# Patient Record
Sex: Female | Born: 1990 | Race: Black or African American | Hispanic: No | Marital: Married | Smoking: Former smoker
Health system: Southern US, Community
[De-identification: ages and names within clinical notes are randomized; demographics above are authoritative.]

## PROBLEM LIST (undated history)

## (undated) DIAGNOSIS — Z973 Presence of spectacles and contact lenses: Secondary | ICD-10-CM

## (undated) DIAGNOSIS — Z8742 Personal history of other diseases of the female genital tract: Secondary | ICD-10-CM

## (undated) DIAGNOSIS — D649 Anemia, unspecified: Secondary | ICD-10-CM

## (undated) DIAGNOSIS — Z8759 Personal history of other complications of pregnancy, childbirth and the puerperium: Secondary | ICD-10-CM

## (undated) DIAGNOSIS — E559 Vitamin D deficiency, unspecified: Secondary | ICD-10-CM

## (undated) DIAGNOSIS — D573 Sickle-cell trait: Secondary | ICD-10-CM

## (undated) HISTORY — PX: NO PAST SURGERIES: SHX2092

---
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
## 2010-02-02 ENCOUNTER — Inpatient Hospital Stay (HOSPITAL_COMMUNITY): Admission: AD | Admit: 2010-02-02 | Discharge: 2010-02-03 | Payer: Self-pay | Admitting: Obstetrics & Gynecology

## 2010-06-09 LAB — WET PREP, GENITAL: Trich, Wet Prep: NONE SEEN

## 2010-06-09 LAB — POCT PREGNANCY, URINE: Preg Test, Ur: NEGATIVE

## 2010-06-09 LAB — CBC
HCT: 39.1 % (ref 36.0–46.0)
Hemoglobin: 13 g/dL (ref 12.0–15.0)
MCHC: 33.4 g/dL (ref 30.0–36.0)
MCV: 88.1 fL (ref 78.0–100.0)
RDW: 13.4 % (ref 11.5–15.5)
WBC: 9.8 10*3/uL (ref 4.0–10.5)

## 2010-06-09 LAB — URINE MICROSCOPIC-ADD ON

## 2010-06-09 LAB — URINALYSIS, ROUTINE W REFLEX MICROSCOPIC
Bilirubin Urine: NEGATIVE
Protein, ur: NEGATIVE mg/dL
Specific Gravity, Urine: 1.025 (ref 1.005–1.030)
pH: 6 (ref 5.0–8.0)

## 2010-06-09 LAB — HCG, QUANTITATIVE, PREGNANCY: hCG, Beta Chain, Quant, S: <2 m[IU]/mL (ref <5)

## 2010-06-09 LAB — ABO/RH: ABO/RH(D): B POS

## 2010-07-02 LAB — URINE MICROSCOPIC-ADD ON

## 2010-07-02 LAB — CBC
HCT: 38.2 % (ref 36.0–46.0)
MCHC: 33.6 g/dL (ref 30.0–36.0)
Platelets: 375 10*3/uL (ref 150–400)
RBC: 4.24 MIL/uL (ref 3.87–5.11)
WBC: 7.9 10*3/uL (ref 4.0–10.5)

## 2010-07-02 LAB — URINALYSIS, ROUTINE W REFLEX MICROSCOPIC
Ketones, ur: 15 mg/dL — AB
Ketones, ur: 15 mg/dL — AB
Protein, ur: 100 mg/dL — AB
Protein, ur: 100 mg/dL — AB
Specific Gravity, Urine: 1.013 (ref 1.005–1.030)

## 2010-07-02 LAB — POCT I-STAT, CHEM 8
Calcium, Ion: 1.17 mmol/L (ref 1.12–1.32)
Chloride: 104 mEq/L (ref 96–112)
Creatinine, Ser: 0.8 mg/dL (ref 0.4–1.2)
Glucose, Bld: 85 mg/dL (ref 70–99)
Hemoglobin: 13.3 g/dL (ref 12.0–15.0)
Potassium: 4.1 mEq/L (ref 3.5–5.1)

## 2010-07-02 LAB — DIFFERENTIAL
Eosinophils Absolute: 0.2 10*3/uL (ref 0.0–0.7)
Lymphs Abs: 2.1 10*3/uL (ref 0.7–4.0)
Neutro Abs: 5.1 10*3/uL (ref 1.7–7.7)
Neutrophils Relative %: 65 % (ref 43–77)

## 2010-07-02 LAB — POCT PREGNANCY, URINE: Preg Test, Ur: NEGATIVE

## 2010-09-28 ENCOUNTER — Inpatient Hospital Stay (HOSPITAL_COMMUNITY)
Admission: AD | Admit: 2010-09-28 | Discharge: 2010-09-28 | Disposition: A | Discharge status: Home / Self Care | Payer: PRIVATE HEALTH INSURANCE | Source: Ambulatory Visit | Attending: Obstetrics & Gynecology | Admitting: Obstetrics & Gynecology

## 2010-09-28 DIAGNOSIS — R319 Hematuria, unspecified: Secondary | ICD-10-CM

## 2010-09-28 DIAGNOSIS — N390 Urinary tract infection, site not specified: Secondary | ICD-10-CM

## 2010-09-28 LAB — POCT PREGNANCY, URINE: Preg Test, Ur: NEGATIVE

## 2010-09-28 LAB — URINE MICROSCOPIC-ADD ON

## 2010-09-28 LAB — URINALYSIS, ROUTINE W REFLEX MICROSCOPIC
Ketones, ur: 15 mg/dL — AB
Specific Gravity, Urine: 1.02 (ref 1.005–1.030)

## 2010-09-30 LAB — URINE CULTURE: Culture  Setup Time: 201207021708

## 2012-09-06 LAB — OB RESULTS CONSOLE HEPATITIS B SURFACE ANTIGEN: Hepatitis B Surface Ag: NEGATIVE

## 2012-09-06 LAB — OB RESULTS CONSOLE RPR: RPR: NONREACTIVE

## 2012-09-06 LAB — OB RESULTS CONSOLE HIV ANTIBODY (ROUTINE TESTING): HIV: NONREACTIVE

## 2012-09-06 LAB — OB RESULTS CONSOLE ANTIBODY SCREEN: ANTIBODY SCREEN: NEGATIVE

## 2012-09-06 LAB — OB RESULTS CONSOLE ABO/RH: RH TYPE: POSITIVE

## 2012-09-06 LAB — OB RESULTS CONSOLE RUBELLA ANTIBODY, IGM: Rubella: IMMUNE

## 2013-03-21 LAB — OB RESULTS CONSOLE GC/CHLAMYDIA
Chlamydia: NEGATIVE
Gonorrhea: NEGATIVE

## 2013-03-23 LAB — OB RESULTS CONSOLE GBS: GBS: NEGATIVE

## 2013-03-29 NOTE — L&D Delivery Note (Signed)
Operative Delivery Note At 12:47 AM a viable female was delivered via Vaginal, Spontaneous Delivery.  Presentation: vertex; Position: Left,, Occiput,, Anterior.  Nuchal cord x1, delivered through cord.  Delivery of the head: 04/14/2013 12:46 AM First maneuver: 04/14/2013 12:46 AM, McRoberts Second maneuver: 04/14/2013 12:47 AM, Suprapubic Pressure   Verbal consent: obtained from patient.  APGAR: 7, 9; weight 8 lb 9 oz (3884 g).   Placenta status: Intact, Spontaneous.   Cord: 3 vessels with the following complications: None.  Cord pH: 7.384 and 7.294.  Baby taken to Nursery to transition, needed O2.  Anesthesia: Epidural  Episiotomy: None Lacerations: 1st degree vaginal extending up left labia Suture Repair: 3.0 vicryl Est. Blood Loss (mL): 300  Mom to postpartum.  Baby to Nursery.  Routine PP orders Breastfeeding Outpatient circumcision    [name], Yesenia Velez 04/14/2013, 1:28 AM

## 2013-04-13 ENCOUNTER — Inpatient Hospital Stay (HOSPITAL_COMMUNITY): Payer: Medicaid Other | Admitting: Anesthesiology

## 2013-04-13 ENCOUNTER — Inpatient Hospital Stay (HOSPITAL_COMMUNITY)
Admission: AD | Admit: 2013-04-13 | Discharge: 2013-04-16 | DRG: 775 | Disposition: A | Discharge status: Home / Self Care | Payer: Medicaid Other | Source: Ambulatory Visit | Attending: Obstetrics and Gynecology | Admitting: Obstetrics and Gynecology

## 2013-04-13 ENCOUNTER — Encounter (HOSPITAL_COMMUNITY): Payer: Self-pay | Admitting: *Deleted

## 2013-04-13 ENCOUNTER — Encounter (HOSPITAL_COMMUNITY): Payer: Medicaid Other | Admitting: Anesthesiology

## 2013-04-13 DIAGNOSIS — Z87891 Personal history of nicotine dependence: Secondary | ICD-10-CM

## 2013-04-13 DIAGNOSIS — O1490 Unspecified pre-eclampsia, unspecified trimester: Secondary | ICD-10-CM

## 2013-04-13 DIAGNOSIS — D573 Sickle-cell trait: Secondary | ICD-10-CM | POA: Diagnosis present

## 2013-04-13 DIAGNOSIS — O9902 Anemia complicating childbirth: Secondary | ICD-10-CM | POA: Diagnosis present

## 2013-04-13 HISTORY — DX: Sickle-cell trait: D57.3

## 2013-04-13 LAB — CBC
HEMATOCRIT: 32.1 % — AB (ref 36.0–46.0)
HEMATOCRIT: 31.7 % — AB (ref 36.0–46.0)
Hemoglobin: 11.1 g/dL — ABNORMAL LOW (ref 12.0–15.0)
Hemoglobin: 11 g/dL — ABNORMAL LOW (ref 12.0–15.0)
MCH: 27.5 pg (ref 26.0–34.0)
MCH: 27.4 pg (ref 26.0–34.0)
MCHC: 34.6 g/dL (ref 30.0–36.0)
MCHC: 34.7 g/dL (ref 30.0–36.0)
MCV: 79.5 fL (ref 78.0–100.0)
MCV: 78.9 fL (ref 78.0–100.0)
Platelets: 342 10*3/uL (ref 150–400)
Platelets: 324 10*3/uL (ref 150–400)
RBC: 4.04 MIL/uL (ref 3.87–5.11)
RBC: 4.02 MIL/uL (ref 3.87–5.11)
RDW: 13.6 % (ref 11.5–15.5)
RDW: 13.5 % (ref 11.5–15.5)
WBC: 13 10*3/uL — ABNORMAL HIGH (ref 4.0–10.5)
WBC: 20.8 10*3/uL — AB (ref 4.0–10.5)

## 2013-04-13 LAB — COMPREHENSIVE METABOLIC PANEL
ALT: 15 U/L (ref 0–35)
AST: 24 U/L (ref 0–37)
Albumin: 2.9 g/dL — ABNORMAL LOW (ref 3.5–5.2)
Alkaline Phosphatase: 138 U/L — ABNORMAL HIGH (ref 39–117)
BILIRUBIN TOTAL: 0.2 mg/dL — AB (ref 0.3–1.2)
BUN: 10 mg/dL (ref 6–23)
CALCIUM: 10.1 mg/dL (ref 8.4–10.5)
CO2: 21 mEq/L (ref 19–32)
Chloride: 102 mEq/L (ref 96–112)
Creatinine, Ser: 0.62 mg/dL (ref 0.50–1.10)
GFR calc Af Amer: >90 mL/min (ref >90)
GFR calc non Af Amer: >90 mL/min (ref >90)
Glucose, Bld: 89 mg/dL (ref 70–99)
Potassium: 4 mEq/L (ref 3.7–5.3)
Sodium: 138 mEq/L (ref 137–147)
TOTAL PROTEIN: 6.9 g/dL (ref 6.0–8.3)

## 2013-04-13 LAB — TYPE AND SCREEN
ABO/RH(D): B POS
Antibody Screen: NEGATIVE

## 2013-04-13 LAB — RPR: RPR Ser Ql: NONREACTIVE

## 2013-04-13 LAB — LACTATE DEHYDROGENASE: LDH: 175 U/L (ref 94–250)

## 2013-04-13 LAB — URIC ACID: URIC ACID, SERUM: 6.8 mg/dL (ref 2.4–7.0)

## 2013-04-13 MED ORDER — OXYTOCIN 10 UNIT/ML IJ SOLN
INTRAMUSCULAR | Status: AC
  Filled 2013-04-13: qty 2

## 2013-04-13 MED ORDER — OXYTOCIN BOLUS FROM INFUSION
500.0000 mL | INTRAVENOUS | Status: DC
  Administered 2013-04-14: 500 mL via INTRAVENOUS

## 2013-04-13 MED ORDER — CITRIC ACID-SODIUM CITRATE 334-500 MG/5ML PO SOLN: 30.0000 mL | ORAL | Status: DC | PRN

## 2013-04-13 MED ORDER — ACETAMINOPHEN 325 MG PO TABS: 650.0000 mg | ORAL_TABLET | ORAL | Status: DC | PRN

## 2013-04-13 MED ORDER — DIPHENHYDRAMINE HCL 50 MG/ML IJ SOLN: 12.5000 mg | INTRAMUSCULAR | Status: DC | PRN

## 2013-04-13 MED ORDER — EPHEDRINE 5 MG/ML INJ
10.0000 mg | INTRAVENOUS | Status: DC | PRN
  Filled 2013-04-13: qty 2

## 2013-04-13 MED ORDER — FENTANYL 2.5 MCG/ML BUPIVACAINE 1/10 % EPIDURAL INFUSION (WH - ANES)
INTRAMUSCULAR | Status: DC | PRN
  Administered 2013-04-13: 14 mL/h via EPIDURAL

## 2013-04-13 MED ORDER — PHENYLEPHRINE 40 MCG/ML (10ML) SYRINGE FOR IV PUSH (FOR BLOOD PRESSURE SUPPORT)
80.0000 ug | PREFILLED_SYRINGE | INTRAVENOUS | Status: DC | PRN
  Filled 2013-04-13: qty 2

## 2013-04-13 MED ORDER — OXYTOCIN 40 UNITS IN LACTATED RINGERS INFUSION - SIMPLE MED
1.0000 m[IU]/min | INTRAVENOUS | Status: DC
  Administered 2013-04-13: 2 m[IU]/min via INTRAVENOUS

## 2013-04-13 MED ORDER — IBUPROFEN 600 MG PO TABS: 600.0000 mg | ORAL_TABLET | Freq: Four times a day (QID) | ORAL | Status: DC | PRN

## 2013-04-13 MED ORDER — LIDOCAINE HCL (PF) 1 % IJ SOLN
30.0000 mL | INTRAMUSCULAR | Status: DC | PRN
Start: 2013-04-13 — End: 2013-04-14
  Filled 2013-04-13: qty 30

## 2013-04-13 MED ORDER — EPHEDRINE 5 MG/ML INJ
INTRAVENOUS | Status: AC
  Filled 2013-04-13: qty 4

## 2013-04-13 MED ORDER — LIDOCAINE HCL (PF) 1 % IJ SOLN
INTRAMUSCULAR | Status: DC | PRN
  Administered 2013-04-13 (×2): 9 mL

## 2013-04-13 MED ORDER — OXYTOCIN 40 UNITS IN LACTATED RINGERS INFUSION - SIMPLE MED
62.5000 mL/h | INTRAVENOUS | Status: DC
  Filled 2013-04-13: qty 1000

## 2013-04-13 MED ORDER — LACTATED RINGERS IV SOLN: 500.0000 mL | Freq: Once | INTRAVENOUS | Status: DC

## 2013-04-13 MED ORDER — PHENYLEPHRINE 40 MCG/ML (10ML) SYRINGE FOR IV PUSH (FOR BLOOD PRESSURE SUPPORT)
PREFILLED_SYRINGE | INTRAVENOUS | Status: AC
  Filled 2013-04-13: qty 10

## 2013-04-13 MED ORDER — LACTATED RINGERS IV SOLN
500.0000 mL | INTRAVENOUS | Status: DC | PRN
  Administered 2013-04-13: 1000 mL via INTRAVENOUS

## 2013-04-13 MED ORDER — OXYCODONE-ACETAMINOPHEN 5-325 MG PO TABS: 1.0000 | ORAL_TABLET | ORAL | Status: DC | PRN

## 2013-04-13 MED ORDER — FENTANYL 2.5 MCG/ML BUPIVACAINE 1/10 % EPIDURAL INFUSION (WH - ANES)
INTRAMUSCULAR | Status: AC
  Administered 2013-04-13: 14 mL/h via EPIDURAL
  Filled 2013-04-13: qty 125

## 2013-04-13 MED ORDER — FENTANYL CITRATE 0.05 MG/ML IJ SOLN: 100.0000 ug | INTRAMUSCULAR | Status: DC | PRN

## 2013-04-13 MED ORDER — FENTANYL 2.5 MCG/ML BUPIVACAINE 1/10 % EPIDURAL INFUSION (WH - ANES)
14.0000 mL/h | INTRAMUSCULAR | Status: DC | PRN
  Administered 2013-04-13: 14 mL/h via EPIDURAL
  Filled 2013-04-13: qty 125

## 2013-04-13 MED ORDER — TERBUTALINE SULFATE 1 MG/ML IJ SOLN: 0.2500 mg | Freq: Once | INTRAMUSCULAR | Status: AC | PRN

## 2013-04-13 MED ORDER — ONDANSETRON HCL 4 MG/2ML IJ SOLN: 4.0000 mg | Freq: Four times a day (QID) | INTRAMUSCULAR | Status: DC | PRN

## 2013-04-13 MED ORDER — LACTATED RINGERS IV SOLN
INTRAVENOUS | Status: DC
  Administered 2013-04-13 (×4): via INTRAVENOUS

## 2013-04-13 NOTE — Progress Notes (Signed)
  Subjective: Patient requesting SVE.  In shower after utilizing birth tub. Desires better pain mgmt. Questions and concerns regarding pain medication and effects on fetus.  Doula and family remain at bedside, supportive.   Objective: BP 130/80  Pulse 102  Temp(Src) 99.1 F (37.3 C) (Oral)  Resp 18  Ht 5\' 4"  (1.626 m)  Wt 226 lb (102.513 kg)  BMI 38.77 kg/m2      FHT: Reassuring per Intermittent Ausc.  UC:   irregular, every 2-5 minutes SVE:   Dilation: 5 Effacement (%): 100 Station: -1 Exam by:: Nigel BridgemanVicki Miking Usrey CNM  Assessment / Plan: Progressive labor GBS (-)  Will proceed with epidural per patient request Reviewed patient and family questions re: epidural  Burnham Trost 04/13/2013, 12:52 PM

## 2013-04-13 NOTE — H&P (Signed)
Yesenia Velez is a 23 y.o. female presenting for SROM, clear fluid, at 0330, appears grossly ruptured w pos fern, denies VB, has GFM.   Pregnancy course: Pt began PNC at [facility] at 9wks, confirmed by [place],  1st trim screen and AFP normal Anatomy US at 19wks, limited cardiac views and L CP cyst noted F/u US at 22wks, anatomy completed and normal, and CP cyst reduced in size Normal 1hr glucola GBS neg   Maternal Medical History:  Reason for admission: Rupture of membranes.  Nausea.  Contractions: Onset was 3-5 hours ago.   Frequency: irregular.   Perceived severity is mild.    Fetal activity: Perceived fetal activity is normal.   Last perceived fetal movement was within the past hour.    Prenatal complications: no prenatal complications Prenatal Complications - Diabetes: none.    OB History   Grav Para Term Preterm Abortions TAB SAB Ect Mult Living   1              Past Medical History  Diagnosis Date  . Sickle cell trait    Past Surgical History  Procedure Laterality Date  . No past surgeries     Family History: family history is not on file. Social History:  reports that she has quit smoking. She does not have any smokeless tobacco history on file. She reports that she does not drink alcohol or use illicit drugs.   Prenatal Transfer Tool  Maternal Diabetes: No Genetic Screening: Normal Maternal Ultrasounds/Referrals: Normal L CP cyst noted at 19wks, reduced in size at f/u at 22wks Fetal Ultrasounds or other Referrals:  None Maternal Substance Abuse:  No Significant Maternal Medications:  None Significant Maternal Lab Results:  Lab values include: Group B Strep negative Other Comments:  None  Review of Systems  Eyes: Negative for blurred vision.  Cardiovascular: Negative for chest pain.  Gastrointestinal: Negative for nausea, vomiting and abdominal pain.  Neurological: Negative for headaches.  All other systems reviewed and are negative.    Dilation:  3.5 Effacement (%): 90 Station: -2 Exam by:: Tonita Cong, CNM Blood pressure 138/92, pulse 101, temperature 98.3 F (36.8 C), temperature source Oral, resp. rate 18, height 5\' 4"  (1.626 m), weight 226 lb (102.513 kg). Maternal Exam:  Uterine Assessment: Contraction strength is mild.  Contraction frequency is irregular.   Abdomen: Patient reports no abdominal tenderness. Fundal height is aga.   Estimated fetal weight is 7-8#.   Fetal presentation: vertex  Introitus: Normal vulva. Normal vagina.  Ferning test: positive.  Amniotic fluid character: clear.  Pelvis: adequate for delivery.   Cervix: Cervix evaluated by digital exam.     Fetal Exam Fetal Monitor Review: Mode: ultrasound.    Fetal State Assessment: Category I - tracings are normal.     Physical Exam  Nursing note and vitals reviewed. Constitutional: She is oriented to person, place, and time. She appears well-developed and well-nourished.  HENT:  Head: Normocephalic.  Eyes: Pupils are equal, round, and reactive to light.  Neck: Normal range of motion.  Cardiovascular: Normal rate, regular rhythm and normal heart sounds.   Respiratory: Effort normal and breath sounds normal.  GI: Soft. Bowel sounds are normal.  Genitourinary: Vagina normal.  Musculoskeletal: Normal range of motion.  Neurological: She is alert and oriented to person, place, and time. She has normal reflexes.  Skin: Skin is warm and dry.  Psychiatric: She has a normal mood and affect. Her behavior is normal.    Prenatal labs: ABO, Rh: --/--/  B POS (01/16 0450) Antibody: NEG (01/16 0450) Rubella: Immune (06/11 0000) RPR: Nonreactive (06/11 0000)  HBsAg: Negative (06/11 0000)  HIV: Non-reactive (06/11 0000)  GBS: Negative (12/26 0000)  Sickle cell trait pos Genetic screens normal   Assessment/Plan: IUP at 2022w3d SROM, without active labor, cervix unchanged from office  GBS neg FHR cat 1 Desires WB BP mildly elevated, CTO at present    Admit to b.s. Per c/w Dr Normand Sloopillard Routine L&D orders  Saline lock ok Will reevaluate later today for need for augmentation if not in active labor     Jayli Fogleman M 04/13/2013, 6:33 AM

## 2013-04-13 NOTE — Progress Notes (Signed)
  Subjective: Patient comfortable in bed.  Family and doula remains supportive at bedside.  Objective: BP 132/76  Pulse 109  Temp(Src) 98.6 F (37 C) (Axillary)  Resp 18  Ht 5\' 4"  (1.626 m)  Wt 226 lb (102.513 kg)  BMI 38.77 kg/m2  SpO2 100%      FHT:  Cat I tracing  UC:   regular, every 1-3 minutes, MVU: 200 SVE:   Dilation: 8 Effacement (%): 100 Station: -1 Exam by:: Nigel BridgemanVicki Isa Hitz CNM Fetus asynclitic with more cervix on right  Assessment / Plan: Progressive Labor Adequate Contractions  Continue present mgmt  Patient encouraged to change positions regularly Will work with position to facilitate rotation and descent   Nigel BridgemanLATHAM, Giuliana Handyside 04/13/2013, 6:33 PM

## 2013-04-13 NOTE — Progress Notes (Signed)
  Subjective:  Sitting in rocking chair. Breathing well with contractions.  SO and doula at bedside.  Tub inflated, but not filled yet.  Objective: BP 135/93  Pulse 101  Temp(Src) 99.7 F (37.6 C) (Axillary)  Resp 16  Ht 5\' 4"  (1.626 m)  Wt 226 lb (102.513 kg)  BMI 38.77 kg/m2      FHT: Cat I Tracing UC:   irregular, every 2-5 minutes SVE:   Deferred at present  Assessment / Plan: Early labor SROM 0330-Clear fluid GBS (-) Desires low intervention, plans H2O birth  Continue present mgmt Intermittent monitoring Reevaluate cervix prn or by 10am Reviewed birthplan  WB consent signed  Nigel BridgemanLATHAM, Janus Vlcek 04/13/2013, 9:43 AM

## 2013-04-13 NOTE — Progress Notes (Signed)
  Subjective: Pt began push at +1 with good effort.  Decent is slow and UCs are coupling with MVUs prior to pushing of 160 then 115.  Discussed with pt options of continuing to push, continuing to push with pitocin, laboring down and laboring down with pitocin.  Pt discussed with family and Doula and was agreeable to labor down with pitocin.    Objective: BP 142/81  Pulse 116  Temp(Src) 98.9 F (37.2 C) (Oral)  Resp 18  Ht 5\' 4"  (1.626 m)  Wt 226 lb (102.513 kg)  BMI 38.77 kg/m2  SpO2 100%   Total I/O In: -  Out: 300 [Urine:300]  FHT:  Cat I UC:   regular, every 2-3 minutes  SVE:   Dilation: 10 Effacement (%): 100 Station: +1 Exam by:: Thayer Inabinet,cnm  Assessment / Plan:  Slow decent with UCs coupling and probably inadequate  Labor: Initate Pitocin and labor down for another hour  Preeclampsia: no signs or symptoms of toxicity and (elevated BPs while pushing, will CTO)  Fetal Wellbeing: Category I  Pain Control: Epidural  I/D: GBS neg; SROM at 0300; Afebrile  Anticipated MOD: NSVD   Marionette Meskill 04/13/2013, 9:10 PM

## 2013-04-13 NOTE — Anesthesia Procedure Notes (Signed)
Epidural Patient location during procedure: OB Start time: 04/13/2013 1:38 PM End time: 04/13/2013 1:42 PM  Staffing Anesthesiologist: Leilani AbleHATCHETT, Jahlil Ziller Performed by: anesthesiologist   Preanesthetic Checklist Completed: patient identified, surgical consent, pre-op evaluation, timeout performed, IV checked, risks and benefits discussed and monitors and equipment checked  Epidural Patient position: sitting Prep: site prepped and draped and DuraPrep Patient monitoring: continuous pulse ox and blood pressure Approach: midline Injection technique: LOR air  Needle:  Needle type: Tuohy  Needle gauge: 17 G Needle length: 9 cm and 9 Needle insertion depth: 6 cm Catheter type: closed end flexible Catheter size: 19 Gauge Catheter at skin depth: 11 cm Test dose: negative and Other  Assessment Sensory level: T9 Events: blood not aspirated, injection not painful, no injection resistance, negative IV test and no paresthesia  Additional Notes Reason for block:procedure for pain

## 2013-04-13 NOTE — Progress Notes (Signed)
  Subjective: Patient comfortable in bed with epidural. Family and doula remains at bedside, supportive.   Objective: BP 118/65  Pulse 99  Temp(Src) 98.6 F (37 C) (Axillary)  Resp 16  Ht 5\' 4"  (1.626 m)  Wt 226 lb (102.513 kg)  BMI 38.77 kg/m2  SpO2 100%      FHT:  A segment of Cat II tracing with run of late decels, possibility due to hypotension s/p epidural. Resolved with position change, IV hydration, and O2. Currently Cat I tracing UC:   irregular, every 2-3 minutes SVE:   7/100/-1, IUPC and foley inserted  Assessment / Plan: Progressive labor GBS (-) S/P Epidural Overall reassuring FHR status  Continue present mgmt Observe labor adequacy for pitocin considerations Patient hopes to avoid pitocin use-issues and concerns addressed.   Nigel BridgemanLATHAM, Lochlin Eppinger 04/13/2013, 4:31 PM

## 2013-04-13 NOTE — Anesthesia Preprocedure Evaluation (Signed)
Anesthesia Evaluation  Patient identified by MRN, date of birth, ID band Patient awake    Reviewed: Allergy & Precautions, H&P , Patient's Chart, lab work & pertinent test results  Airway Mallampati: II TM Distance: >3 FB Neck ROM: full    Dental no notable dental hx.    Pulmonary neg pulmonary ROS, former smoker,    Pulmonary exam normal       Cardiovascular negative cardio ROS      Neuro/Psych negative neurological ROS  negative psych ROS   GI/Hepatic negative GI ROS, Neg liver ROS,   Endo/Other  Morbid obesity  Renal/GU negative Renal ROS  negative genitourinary   Musculoskeletal   Abdominal (+) + obese,   Peds  Hematology negative hematology ROS (+)   Anesthesia Other Findings   Reproductive/Obstetrics (+) Pregnancy                           Anesthesia Physical Anesthesia Plan  ASA: III  Anesthesia Plan: Epidural   Post-op Pain Management:    Induction:   Airway Management Planned:   Additional Equipment:   Intra-op Plan:   Post-operative Plan:   Informed Consent: I have reviewed the patients History and Physical, chart, labs and discussed the procedure including the risks, benefits and alternatives for the proposed anesthesia with the patient or authorized representative who has indicated his/her understanding and acceptance.     Plan Discussed with:   Anesthesia Plan Comments:         Anesthesia Quick Evaluation

## 2013-04-13 NOTE — Progress Notes (Signed)
  Subjective: Pt is comfortable with Epidural however, notes pressure with UCs. Discussed with pt the option to labor down for an hour now that she is complete and pt was agreeable.  Objective: BP 129/69  Pulse 115  Temp(Src) 98.9 F (37.2 C) (Oral)  Resp 16  Ht 5\' 4"  (1.626 m)  Wt 226 lb (102.513 kg)  BMI 38.77 kg/m2  SpO2 100%   Total I/O In: -  Out: 300 [Urine:300]  FHT:  Cat II - position change, cervical check, resolved so will labor down UC:   regular, every 2-4 minutes  SVE:   Dilation: 10 Effacement (%): 100 Station: +1 Exam by:: Victorino DikeJennifer Lysa Livengood,cnm  Assessment / Plan:  Spontaneous labor, progressing normally  Labor: Progressing normally  Preeclampsia: no signs or symptoms of toxicity  Fetal Wellbeing: Category II  Pain Control: Epidural  I/D: GBS neg; SROM at 0300; Afebrile Anticipated MOD: NSVD   Yesenia Velez 04/13/2013, 7:37 PM

## 2013-04-13 NOTE — Progress Notes (Signed)
  Subjective: Patient on birthing ball.  Reports increase in ctx intensity, but continues to cope with them.  Having some nausea, but no vomiting. Doula remains supportive at bedside.  Objective: BP 135/93  Pulse 115  Temp(Src) 99.7 F (37.6 C) (Axillary)  Resp 16  Ht 5\' 4"  (1.626 m)  Wt 226 lb (102.513 kg)  BMI 38.77 kg/m2      Filed Vitals:   04/13/13 0744 04/13/13 0814 04/13/13 0854 04/13/13 0927  BP: 135/85 120/73 135/88 135/93  Pulse: 99 105 101 115  Temp:    99.7 F (37.6 C)  TempSrc:    Axillary  Resp: 18 16 16 16   Height:      Weight:        FHT:  Cat I Tracing UC:   irregular, every 2-4 minutes, moderate to palpation SVE:   Dilation: 3.5 Effacement (%): 100 Station: -1 Exam by:: Nigel BridgemanVicki Yvonnie Schinke CNM  Assessment / Plan: Early labor GBS (-) SROM at 0330  Desires low intervention labor/birth  Continue present mgmt Encouraged to utilize birthing tub Recheck patient in 2hrs or prn  Consider augmentation prn  Nishita Isaacks 04/13/2013, 10:06 AM

## 2013-04-13 NOTE — Progress Notes (Signed)
  Subjective: At the time we were planing to begin pushing pt had an episode of emesis.  Pt asked to wait 30 min before pushing again in order to allow her to recover.  Labor progressing, now +2 station with adequate UCs.  MVU 155-225.    Objective: BP 146/88  Pulse 110  Temp(Src) 98.7 F (37.1 C) (Oral)  Resp 20  Ht 5\' 4"  (1.626 m)  Wt 226 lb (102.513 kg)  BMI 38.77 kg/m2  SpO2 100%   Total I/O In: -  Out: 300 [Urine:300]  FHT:  Cat II - position change and SVE UC:   regular, every 2-3 minutes  SVE:   Dilation: 10 Effacement (%): 100 Station: +2 Exam by:: Annora Guderian,cnm  Assessment / Plan:  Augmentation of labor, progressing well Pitocin at 4 miliU Labor: Progressing on Pitocin, will continue to increase then AROM  Preeclampsia: no signs or symptoms of toxicity  Fetal Wellbeing: Category II  Pain Control: Epidural  I/D: GBS neg; SROM at 0300; Afebrile   Anticipated MOD: NSVD   Takoda Siedlecki 04/13/2013, 10:37 PM

## 2013-04-13 NOTE — MAU Note (Signed)
PT SAYS ON WED IN OFFICE - LOOSE 3 CM.     SHE AWOKE AT 0300 AND FELT UC-   THEN FELT FLUID COME OUT .   DENIES  HSV AND MRSA.

## 2013-04-14 ENCOUNTER — Encounter (HOSPITAL_COMMUNITY): Payer: Self-pay | Admitting: *Deleted

## 2013-04-14 LAB — CBC
HCT: 30.5 % — ABNORMAL LOW (ref 36.0–46.0)
Hemoglobin: 10.7 g/dL — ABNORMAL LOW (ref 12.0–15.0)
MCH: 27.5 pg (ref 26.0–34.0)
MCHC: 35.1 g/dL (ref 30.0–36.0)
MCV: 78.4 fL (ref 78.0–100.0)
PLATELETS: 268 10*3/uL (ref 150–400)
RBC: 3.89 MIL/uL (ref 3.87–5.11)
RDW: 13.7 % (ref 11.5–15.5)
WBC: 22.4 10*3/uL — ABNORMAL HIGH (ref 4.0–10.5)

## 2013-04-14 LAB — COMPREHENSIVE METABOLIC PANEL
ALT: 13 U/L (ref 0–35)
AST: 25 U/L (ref 0–37)
Albumin: 2.4 g/dL — ABNORMAL LOW (ref 3.5–5.2)
Alkaline Phosphatase: 121 U/L — ABNORMAL HIGH (ref 39–117)
BUN: 11 mg/dL (ref 6–23)
CALCIUM: 9.8 mg/dL (ref 8.4–10.5)
CO2: 19 meq/L (ref 19–32)
Chloride: 101 mEq/L (ref 96–112)
Creatinine, Ser: 1.09 mg/dL (ref 0.50–1.10)
GFR calc Af Amer: 83 mL/min — ABNORMAL LOW (ref >90)
GFR calc non Af Amer: 71 mL/min — ABNORMAL LOW (ref >90)
Glucose, Bld: 109 mg/dL — ABNORMAL HIGH (ref 70–99)
Potassium: 3.9 mEq/L (ref 3.7–5.3)
SODIUM: 137 meq/L (ref 137–147)
Total Bilirubin: 0.5 mg/dL (ref 0.3–1.2)
Total Protein: 5.7 g/dL — ABNORMAL LOW (ref 6.0–8.3)

## 2013-04-14 LAB — PROTEIN / CREATININE RATIO, URINE
Creatinine, Urine: 114.37 mg/dL
Protein Creatinine Ratio: 0.33 — ABNORMAL HIGH (ref 0.00–0.15)
TOTAL PROTEIN, URINE: 37.7 mg/dL

## 2013-04-14 LAB — LACTATE DEHYDROGENASE: LDH: 192 U/L (ref 94–250)

## 2013-04-14 LAB — URIC ACID: Uric Acid, Serum: 8.7 mg/dL — ABNORMAL HIGH (ref 2.4–7.0)

## 2013-04-14 LAB — MRSA PCR SCREENING: MRSA by PCR: NEGATIVE

## 2013-04-14 MED ORDER — SIMETHICONE 80 MG PO CHEW: 80.0000 mg | CHEWABLE_TABLET | ORAL | Status: DC | PRN

## 2013-04-14 MED ORDER — ZOLPIDEM TARTRATE 5 MG PO TABS: 5.0000 mg | ORAL_TABLET | Freq: Every evening | ORAL | Status: DC | PRN

## 2013-04-14 MED ORDER — DIBUCAINE 1 % RE OINT: 1.0000 "application " | TOPICAL_OINTMENT | RECTAL | Status: DC | PRN

## 2013-04-14 MED ORDER — SENNOSIDES-DOCUSATE SODIUM 8.6-50 MG PO TABS
2.0000 | ORAL_TABLET | ORAL | Status: DC
  Administered 2013-04-15: 2 via ORAL
  Filled 2013-04-14: qty 2

## 2013-04-14 MED ORDER — MAGNESIUM SULFATE 40 G IN LACTATED RINGERS - SIMPLE
2.0000 g/h | INTRAVENOUS | Status: DC
  Administered 2013-04-14: 2 g/h via INTRAVENOUS
  Filled 2013-04-14 (×2): qty 500

## 2013-04-14 MED ORDER — LACTATED RINGERS IV SOLN
INTRAVENOUS | Status: DC
  Administered 2013-04-14 – 2013-04-15 (×3): via INTRAVENOUS

## 2013-04-14 MED ORDER — TETANUS-DIPHTH-ACELL PERTUSSIS 5-2.5-18.5 LF-MCG/0.5 IM SUSP: 0.5000 mL | Freq: Once | INTRAMUSCULAR | Status: DC

## 2013-04-14 MED ORDER — DIPHENHYDRAMINE HCL 25 MG PO CAPS: 25.0000 mg | ORAL_CAPSULE | Freq: Four times a day (QID) | ORAL | Status: DC | PRN

## 2013-04-14 MED ORDER — IBUPROFEN 600 MG PO TABS
600.0000 mg | ORAL_TABLET | Freq: Four times a day (QID) | ORAL | Status: DC
  Administered 2013-04-14 – 2013-04-16 (×10): 600 mg via ORAL
  Filled 2013-04-14 (×10): qty 1

## 2013-04-14 MED ORDER — WITCH HAZEL-GLYCERIN EX PADS: 1.0000 "application " | MEDICATED_PAD | CUTANEOUS | Status: DC | PRN

## 2013-04-14 MED ORDER — ONDANSETRON HCL 4 MG/2ML IJ SOLN: 4.0000 mg | INTRAMUSCULAR | Status: DC | PRN

## 2013-04-14 MED ORDER — LANOLIN HYDROUS EX OINT: TOPICAL_OINTMENT | CUTANEOUS | Status: DC | PRN

## 2013-04-14 MED ORDER — MAGNESIUM SULFATE BOLUS VIA INFUSION
4.0000 g | Freq: Once | INTRAVENOUS | Status: AC
  Administered 2013-04-14: 4 g via INTRAVENOUS
  Filled 2013-04-14: qty 500

## 2013-04-14 MED ORDER — PRENATAL MULTIVITAMIN CH
1.0000 | ORAL_TABLET | Freq: Every day | ORAL | Status: DC
  Administered 2013-04-14 – 2013-04-16 (×3): 1 via ORAL
  Filled 2013-04-14 (×3): qty 1

## 2013-04-14 MED ORDER — BENZOCAINE-MENTHOL 20-0.5 % EX AERO
1.0000 "application " | INHALATION_SPRAY | CUTANEOUS | Status: DC | PRN
  Administered 2013-04-14: 1 via TOPICAL
  Filled 2013-04-14 (×2): qty 56

## 2013-04-14 MED ORDER — LACTATED RINGERS IV SOLN: INTRAVENOUS | Status: DC

## 2013-04-14 MED ORDER — OXYCODONE-ACETAMINOPHEN 5-325 MG PO TABS: 1.0000 | ORAL_TABLET | ORAL | Status: DC | PRN

## 2013-04-14 MED ORDER — PANTOPRAZOLE SODIUM 40 MG PO TBEC
40.0000 mg | DELAYED_RELEASE_TABLET | Freq: Once | ORAL | Status: AC
  Administered 2013-04-14: 40 mg via ORAL
  Filled 2013-04-14: qty 1

## 2013-04-14 MED ORDER — ONDANSETRON HCL 4 MG PO TABS: 4.0000 mg | ORAL_TABLET | ORAL | Status: DC | PRN

## 2013-04-14 NOTE — Lactation Note (Signed)
This note was copied from the chart of Yesenia Omaria Katrinka Velez. Lactation Consultation Note  Patient Name: Yesenia Velez ZOXWR'UToday's Date: 04/14/2013 Reason for consult: Initial assessment;NICU baby  Visited with Mom in AICU, baby 12 hrs old, and in the NICU.  Mom on MgS04 for hypertension.  Baby born at 39w 4d, with respiratory difficulty at birth.  Mom was started pumping earlier today and has colostrum in a vial in the refrigerator.  She plans to take EBM to NICU for baby.  Talked about importance of breast massage, and manual expression (demonstrated this) prior to double pumping and after.  Encouraged skin to skin when she is able to.  Mom has NICU brochure in room, encouraged her to look at this. Mom explained about routine and cleaning and milk storage guidelines.  Basic brochure given to Mom, and explained about IP and OP lactation resources available to her.  Mom has WIC, and discussed pump loaner procedure as needed on discharge.  To follow up in am.  Consult Status Consult Status: Follow-up Date: 04/15/13 Follow-up type: In-patient    Judee ClaraSmith, Anokhi Shannon E 04/14/2013, 12:51 PM

## 2013-04-14 NOTE — Anesthesia Postprocedure Evaluation (Signed)
  Anesthesia Post-op Note  Patient: Yesenia Velez  Procedure(s) Performed: * No procedures listed *  Patient Location: PACU and Mother/Baby  Anesthesia Type:Epidural  Level of Consciousness: awake, alert  and oriented  Airway and Oxygen Therapy: Patient Spontanous Breathing  Post-op Pain: none  Post-op Assessment: Post-op Vital signs reviewed, Patient's Cardiovascular Status Stable, No headache, No backache, No residual numbness and No residual motor weakness  Post-op Vital Signs: Reviewed and stable  Complications: No apparent anesthesia complications

## 2013-04-14 NOTE — Progress Notes (Signed)
Post Partum Day 0: S/P NSVD  Subjective: PIH labs abnormal Discussed with pt the plan to treat with Magnesium Sulfate, pt voices understanding and is agreeable.  Baby transferred from nursery to NICU.  Objective: Blood pressure 151/85, pulse 92, temperature 99.1 F (37.3 C), temperature source Oral, resp. rate 16, height 5\' 4"  (1.626 m), weight 226 lb (102.513 kg), SpO2 100.00%, unknown if currently breastfeeding.  Results for orders placed during the hospital encounter of 04/13/13 (from the past 24 hour(s))  CBC     Status: Abnormal   Collection Time    04/13/13  4:50 AM      Result Value Range   WBC 13.0 (*) 4.0 - 10.5 K/uL   RBC 4.04  3.87 - 5.11 MIL/uL   Hemoglobin 11.1 (*) 12.0 - 15.0 g/dL   HCT 16.1 (*) 09.6 - 04.5 %   MCV 79.5  78.0 - 100.0 fL   MCH 27.5  26.0 - 34.0 pg   MCHC 34.6  30.0 - 36.0 g/dL   RDW 40.9  81.1 - 91.4 %   Platelets 342  150 - 400 K/uL  RPR     Status: None   Collection Time    04/13/13  4:50 AM      Result Value Range   RPR NON REACTIVE  NON REACTIVE  TYPE AND SCREEN     Status: None   Collection Time    04/13/13  4:50 AM      Result Value Range   ABO/RH(D) B POS     Antibody Screen NEG     Sample Expiration 04/16/2013    COMPREHENSIVE METABOLIC PANEL     Status: Abnormal   Collection Time    04/13/13  4:50 AM      Result Value Range   Sodium 138  137 - 147 mEq/L   Potassium 4.0  3.7 - 5.3 mEq/L   Chloride 102  96 - 112 mEq/L   CO2 21  19 - 32 mEq/L   Glucose, Bld 89  70 - 99 mg/dL   BUN 10  6 - 23 mg/dL   Creatinine, Ser 7.82  0.50 - 1.10 mg/dL   Calcium 95.6  8.4 - 21.3 mg/dL   Total Protein 6.9  6.0 - 8.3 g/dL   Albumin 2.9 (*) 3.5 - 5.2 g/dL   AST 24  0 - 37 U/L   ALT 15  0 - 35 U/L   Alkaline Phosphatase 138 (*) 39 - 117 U/L   Total Bilirubin 0.2 (*) 0.3 - 1.2 mg/dL   GFR calc non Af Amer >90  >90 mL/min   GFR calc Af Amer >90  >90 mL/min  LACTATE DEHYDROGENASE     Status: None   Collection Time    04/13/13  4:50 AM   Result Value Range   LDH 175  94 - 250 U/L  URIC ACID     Status: None   Collection Time    04/13/13  4:50 AM      Result Value Range   Uric Acid, Serum 6.8  2.4 - 7.0 mg/dL  CBC     Status: Abnormal   Collection Time    04/13/13 12:57 PM      Result Value Range   WBC 20.8 (*) 4.0 - 10.5 K/uL   RBC 4.02  3.87 - 5.11 MIL/uL   Hemoglobin 11.0 (*) 12.0 - 15.0 g/dL   HCT 08.6 (*) 57.8 - 46.9 %   MCV 78.9  78.0 -  100.0 fL   MCH 27.4  26.0 - 34.0 pg   MCHC 34.7  30.0 - 36.0 g/dL   RDW 16.113.5  09.611.5 - 04.515.5 %   Platelets 324  150 - 400 K/uL  PROTEIN / CREATININE RATIO, URINE     Status: Abnormal   Collection Time    04/13/13 11:15 PM      Result Value Range   Creatinine, Urine 114.37     Total Protein, Urine 37.7     PROTEIN CREATININE RATIO 0.33 (*) 0.00 - 0.15  CBC     Status: Abnormal   Collection Time    04/14/13  1:55 AM      Result Value Range   WBC 22.4 (*) 4.0 - 10.5 K/uL   RBC 3.89  3.87 - 5.11 MIL/uL   Hemoglobin 10.7 (*) 12.0 - 15.0 g/dL   HCT 40.930.5 (*) 81.136.0 - 91.446.0 %   MCV 78.4  78.0 - 100.0 fL   MCH 27.5  26.0 - 34.0 pg   MCHC 35.1  30.0 - 36.0 g/dL   RDW 78.213.7  95.611.5 - 21.315.5 %   Platelets 268  150 - 400 K/uL  COMPREHENSIVE METABOLIC PANEL     Status: Abnormal   Collection Time    04/14/13  1:55 AM      Result Value Range   Sodium 137  137 - 147 mEq/L   Potassium 3.9  3.7 - 5.3 mEq/L   Chloride 101  96 - 112 mEq/L   CO2 19  19 - 32 mEq/L   Glucose, Bld 109 (*) 70 - 99 mg/dL   BUN 11  6 - 23 mg/dL   Creatinine, Ser 0.861.09  0.50 - 1.10 mg/dL   Calcium 9.8  8.4 - 57.810.5 mg/dL   Total Protein 5.7 (*) 6.0 - 8.3 g/dL   Albumin 2.4 (*) 3.5 - 5.2 g/dL   AST 25  0 - 37 U/L   ALT 13  0 - 35 U/L   Alkaline Phosphatase 121 (*) 39 - 117 U/L   Total Bilirubin 0.5  0.3 - 1.2 mg/dL   GFR calc non Af Amer 71 (*) >90 mL/min   GFR calc Af Amer 83 (*) >90 mL/min  LACTATE DEHYDROGENASE     Status: None   Collection Time    04/14/13  1:55 AM      Result Value Range   LDH 192  94 -  250 U/L  URIC ACID     Status: Abnormal   Collection Time    04/14/13  1:55 AM      Result Value Range   Uric Acid, Serum 8.7 (*) 2.4 - 7.0 mg/dL    Assessment/Plan: S/P Vaginal delivery day 0 Transfer pt to AICU  Magnesium Sulfate initiated    LOS: 1 day   Yesenia Velez 04/14/2013, 1:50 AM

## 2013-04-15 DIAGNOSIS — O1490 Unspecified pre-eclampsia, unspecified trimester: Secondary | ICD-10-CM

## 2013-04-15 LAB — CBC
HCT: 26.8 % — ABNORMAL LOW (ref 36.0–46.0)
HEMOGLOBIN: 9.2 g/dL — AB (ref 12.0–15.0)
MCH: 27.1 pg (ref 26.0–34.0)
MCHC: 34.3 g/dL (ref 30.0–36.0)
MCV: 79.1 fL (ref 78.0–100.0)
Platelets: 297 10*3/uL (ref 150–400)
RBC: 3.39 MIL/uL — AB (ref 3.87–5.11)
RDW: 13.9 % (ref 11.5–15.5)
WBC: 14.5 10*3/uL — AB (ref 4.0–10.5)

## 2013-04-15 NOTE — Progress Notes (Signed)
Postpartum Note Day # 1  S:  Patient resting comfortable in bed.  Pain controlled.  Tolerating general. Ambulating without difficulty. Voiding freely. She denies n/v/f/c, SOB, or CP.  No headache, no blurry vision, no RUQ pain.  O: BP 131/89  Pulse 92  Temp(Src) 98 F (36.7 C) (Oral)  Resp 16  Ht 5\' 5"  (1.651 m)  Wt 99.701 kg (219 lb 12.8 oz)  BMI 36.58 kg/m2  SpO2 98% BP range: 107-131/60-89  Gen: A&Ox3, NAD CV: RRR, no MRG Resp: CTAB Abdomen: soft, NT, ND Uterus: firm, non-tender, below umbilicus Ext: No edema, no calf tenderness bilaterally, SCDs in place  Labs:  CBC    Component Value Date/Time   WBC 14.5* 04/15/2013 0535   RBC 3.39* 04/15/2013 0535   HGB 9.2* 04/15/2013 0535   HCT 26.8* 04/15/2013 0535   PLT 297 04/15/2013 0535   MCV 79.1 04/15/2013 0535   MCH 27.1 04/15/2013 0535   MCHC 34.3 04/15/2013 0535   RDW 13.9 04/15/2013 0535   LYMPHSABS 2.1 01/21/2009 1540   MONOABS 0.4 01/21/2009 1540   EOSABS 0.2 01/21/2009 1540   BASOSABS 0.0 01/21/2009 1540    A/P: Pt is a 23 y.o. G1P1001 s/p NSVD PPD#1  -Preeclampsia: Will d/c Magnesium now.  BP remains stable, no medication needed.  Pt asymptomatic.  UOP adequate, will continue to monitor. -Transfer to postpartum unit -Continue routine postpartum care -Activity: encouraged sitting up to chair and ambulation as tolerated -CBC stable as above  Myna HidalgoJennifer Vear Staton, DO [phone] (pager) [phone] (office)

## 2013-04-16 MED ORDER — IBUPROFEN 600 MG PO TABS: 600.0000 mg | ORAL_TABLET | Freq: Four times a day (QID) | ORAL | Status: DC

## 2013-04-16 MED ORDER — OXYCODONE-ACETAMINOPHEN 5-325 MG PO TABS: 1.0000 | ORAL_TABLET | ORAL | Status: DC | PRN

## 2013-04-16 NOTE — Plan of Care (Signed)
Problem: Discharge Progression Outcomes Goal: Barriers To Progression Addressed/Resolved Outcome: Completed/Met Date Met:  04/16/13 Blood pressures WNL,DTR's WNL

## 2013-04-16 NOTE — Discharge Summary (Signed)
Obstetric Discharge Summary  Reason for Admission: onset of labor Prenatal Procedures: none Intrapartum Procedures: spontaneous vaginal delivery by Haroldine LawsJennifer Oxley CNM Postpartum Procedures: none Complications-Operative and Postpartum: none  Hemoglobin  Date Value Range Status  04/15/2013 9.2* 12.0 - 15.0 g/dL Final     HCT  Date Value Range Status  04/15/2013 26.8* 36.0 - 46.0 % Final    Discharge Diagnoses: Term Pregnancy-delivered  Discharge Information:  Date: 04/16/2013 Activity: unrestricted Diet: routine Medications: Ibuprofen, Colace and Percocet Condition: stable  Breastfeeding: yes  Instructions: refer to practice specific booklet Discharge to: home Undecided about contraception   Newborn Data: Live born  Information for the patient's newborn:  Percell LocusSmith, Boy Denisia [[id number]][[id number]]  female   Home with mother.  Trysten Bernard A MD 04/16/2013, 1:22 PM

## 2013-04-16 NOTE — Discharge Instructions (Signed)

## 2013-04-16 NOTE — Progress Notes (Signed)
Pt discharged home with husband... Condition stable... No equipment... Pt to visit baby in NICU for feeding and will leave from NICU.

## 2013-04-16 NOTE — Progress Notes (Signed)
Ur chart review completed.  

## 2013-04-16 NOTE — Progress Notes (Signed)
D/c instructions and prescriptions reviewed with pt. Pt verbalized understanding. Pt states she would like to stay to 1900 to pump her breast one more time. RN advised pt that she is discharged and after she has pumped her breast she can go home.  Pt has f/u appt with [facility] in 6 weeks and [facility] loan[facility] pump [facility]

## 2013-04-16 NOTE — Lactation Note (Signed)
This note was copied from the chart of Yesenia Shaquetta Katrinka Velez. Lactation Consultation NoteLactation Consultation Note    Follow up consutl  with this mom of a NICU term baby, now 2837 hours old. Mom is pumping up to 45 mls at a time already. I advised mom to pump 15 -30 minutes, until she stops dripping, in standard setting. Mom advised also to call [facility] and leave a message that she will be in on 1/20 for a DEP, since her baby is in NICU.        I assisted mom with lathcing the baby in football hold. Mom has flat nipples, and baby would not latch. I fitted mom for a 20 nipple shiled, and placed EBM in shiled w a syringe and ng tube. He transferred 15 mls, and then was done. EBM by bottle affered after. I will follow up with this mom tomorrow, prior to her discharged.    Patient Name: Yesenia Velez ZOXWR'UToday's Date: 04/16/2013 Reason for consult: Follow-up assessment;NICU baby   Maternal Data    Feeding Feeding Type: Breast Milk Nipple Type: Slow - flow Length of feed: 15 min  LATCH Score/Interventions Latch: Repeated attempts needed to sustain latch, nipple held in mouth throughout feeding, stimulation needed to elicit sucking reflex. (20 nipple shield placed w sns under ns) Intervention(s): Skin to skin;Teach feeding cues;Waking techniques Intervention(s): Adjust position;Assist with latch;Breast massage;Breast compression  Audible Swallowing: A few with stimulation Intervention(s): Skin to skin;Hand expression  Type of Nipple: Flat  Comfort (Breast/Nipple): Soft / non-tender     Hold (Positioning): Assistance needed to correctly position infant at breast and maintain latch. Intervention(s): Breastfeeding basics reviewed;Support Pillows;Position options;Skin to skin  LATCH Score: 6  Lactation Tools Discussed/Used WIC Program: Yes (mom to get DEP tomoorrow - [facility] Pump Review: Setup, frequency, and cleaning (mom to pump in standard, 15-30 minutes , until she  stops dripping, hand expression reviewed w mom, advised to add to each pumping)   Consult Status Consult Status: Follow-up Date: 04/17/13 Follow-up type: In-patient    Alfred LevinsLee, Amai Cappiello Anne 04/16/2013, 4:14 PM

## 2013-04-19 NOTE — Progress Notes (Signed)
Clinical Social Work Department  PSYCHOSOCIAL ASSESSMENT - MATERNAL/CHILD  04/18/2013-Copied and pasted from baby's chart  Patient: Yesenia Velez, Yesenia Velez Account Number: [id number] Admit Date: 04/14/2013  Yesenia Velez Name:  Yesenia Velez   Clinical Social Worker: Yesenia Piedra, LCSW Date/Time: 04/18/2013 11:00 AM  Date Referred:  Other referral source:  No referral-NICU admission   I: FAMILY / [place] legal guardian: PARENT  Guardian - Name  Guardian - Age  Guardian - Address   Aretta Tamala Julian  [address], [place], [place] [address]   Melony Overly     Other household support members/support persons  Other support:  MOB states that she has a good support system of family and friends   II PSYCHOSOCIAL DATA  Information Source: Patient Interview  Occupational hygienist  Employment:  MOB works at [employer]   FOB is currently working at a [employer] resources: [employer]  If [place]: [employer] / Grade:  Maternity Care Coordinator / Child Services Coordination / Early Interventions: Cultural issues impacting care:  None stated   III STRENGTHS  Strengths   Adequate Resources   Compliance with medical plan   Home prepared for Child (including basic supplies)   Other - See comment   Supportive family/friends   Understanding of illness   Strength comment: Pediatric follow up will be at [facility]  IV RISK FACTORS AND CURRENT PROBLEMS  Current Problem: None  Risk Factor & Current Problem  Patient Issue  Family Issue  Risk Factor / Current Problem Comment    N  N    V SOCIAL WORK ASSESSMENT  CSW met with MOB at baby's bedside to introduce myself, offer support and complete assessment due to NICU admission. MOB was holding baby and bonding is evident. MOB was quiet, but welcomed CSW to sit with her. She states it is "depressing" to have a baby in the NICU. CSW validated her feelings and encouraged her to allow herself  to be emotional. CSW asked her to monitor her feelings moving forward and discussed signs and symptoms of PPD to watch for. CSW explained having a baby in the NICU can increase the risk of developing PPD. MOB stated understanding. CSW explained that it is normal to feel sad and angry about being separated from her baby and that although PPD is normal also, it is important that she talk with CSW or her doctor if symptoms arise. She agreed. She reports having a good support system, but seems to be feeling smothered by them at this time. CSW encouraged her to talk openly with her family and friends about how to best support her through this situation. CSW asked her how she feels FOB is handling baby NICU admission and MOB states she does not know. She states he really has not talked about it. She reports having a good relationship with him and states that they are together and that he is involved and supportive. She states she has everything she needs for baby at home and is just ready for baby to be home with her. She report no questions, concerns or needs at this time and thanked CSW for the visit. CSW explained ongoing support services offered by NICU CSW and gave contact information. CSW is not aware of any social concerns at this time and identifies no barriers to discharge when baby is medically ready.  VI SOCIAL WORK PLAN  Social Work Plan   Psychosocial Support/Ongoing Assessment of  Needs   Patient/Family Education   Type of pt/family education:  PPD signs and symptoms   Ongoing support services offered by NICU CSW   If child protective services report - county:  If child protective services report - date:  Information/referral to community resources comment:  no referral needs noted at this time.   Other social work plan:

## 2013-04-20 ENCOUNTER — Ambulatory Visit: Payer: Self-pay

## 2013-04-20 NOTE — Lactation Note (Signed)
This note was copied from the chart of Yesenia Loanne Katrinka Velez. Lactation Consultation Note Follow up consultation with mom and dad in rooming-in room. Mom states she is pumping about 8 ounces per session, and that breasts remain full. Baby is taking about 2 ounces then sleeps soundly.  Mom also states that nipples hurt from pumping too much, and she is not interested at this time in latching baby to breast. Discussed strategies to reduce discomfort while pumping, and to reduce inflammation in the breast.  Enc mom to call the lactation office if she has any concerns, and when she is ready to latch baby. Enc mom to attend the [facility].  Patient Name: Yesenia Velez ZOXWR'UToday's Date: 04/20/2013     Maternal Data    Feeding    LATCH Score/Interventions                      Lactation Tools Discussed/Used     Consult Status      Lenard ForthSanders, Kallie Depolo Fulmer 04/20/2013, 10:35 AM

## 2014-01-28 ENCOUNTER — Encounter (HOSPITAL_COMMUNITY): Payer: Self-pay | Admitting: *Deleted

## 2014-03-29 NOTE — L&D Delivery Note (Signed)
Final Labor Progress Note At 1300 pt reports an increased in rectal pressure.  FHR remained reassuring with occasional variable decelerations.  Vaginal Delivery Note The pt utilized an epidural as pain management.   Spontaneous rupture of membranes today, at 0700, light meconium.  GBS was negative.  Cervical dilation was complete at 1304.  NICHD Category 2.    Pushing with guidance began at  1402.   After 5 minutes of pushing the head, shoulders and the body of a viable female infant "Anabella" delivered spontaneously with maternal effort in the ROP position at 1407.   With vigorous tone and spontaneous cry, the infant was placed on moms abd.  After the umbilical cord was clamped it was cut by the FOB, then cord blood was obtained for evaluation.  Spontaneous delivery of a intact placenta with a 3 vessel cord via Shultz at 1421.   Episiotomy: None   The vulva, perineum, vaginal vault, rectum and cervix were inspected and revealed a 1 degree perineal  which was repair using a43-0 vicryl on a CT needle.  Lidocaine was not used, the epidural was sufficient for the repair.    Postpartum pitocin as ordered.  Fundus firm, lochia minimum, bleeding under control. EBL 50, Pt hemodynamically stable.   Sponge, laps and needle count correct and verified with the primary care nurse.  Attending MD available at all times.    Routine postpartum orders   Mother unsure about method of contraception  Placenta to pathology: NO     Cord Gases sent to lab: NO Cord blood sent to lab: YES   APGARS:  9 at 1 minute and 9 at 5 minutes Weight:. pending     Both mom and baby were left in stable condition      Kerisha Goughnour, CNM, MSN 11/07/2014. 2:44 PM

## 2014-09-24 ENCOUNTER — Encounter (HOSPITAL_COMMUNITY): Payer: Self-pay | Admitting: *Deleted

## 2014-09-24 ENCOUNTER — Inpatient Hospital Stay (HOSPITAL_COMMUNITY)
Admission: AD | Admit: 2014-09-24 | Discharge: 2014-09-24 | Disposition: A | Discharge status: Home / Self Care | Payer: Medicaid Other | Source: Ambulatory Visit | Attending: Obstetrics and Gynecology | Admitting: Obstetrics and Gynecology

## 2014-09-24 DIAGNOSIS — Z3A34 34 weeks gestation of pregnancy: Secondary | ICD-10-CM | POA: Insufficient documentation

## 2014-09-24 DIAGNOSIS — Z87891 Personal history of nicotine dependence: Secondary | ICD-10-CM | POA: Insufficient documentation

## 2014-09-24 DIAGNOSIS — R030 Elevated blood-pressure reading, without diagnosis of hypertension: Secondary | ICD-10-CM | POA: Diagnosis present

## 2014-09-24 DIAGNOSIS — R51 Headache: Secondary | ICD-10-CM | POA: Insufficient documentation

## 2014-09-24 DIAGNOSIS — O9989 Other specified diseases and conditions complicating pregnancy, childbirth and the puerperium: Secondary | ICD-10-CM | POA: Insufficient documentation

## 2014-09-24 DIAGNOSIS — D573 Sickle-cell trait: Secondary | ICD-10-CM

## 2014-09-24 LAB — PROTEIN / CREATININE RATIO, URINE
Creatinine, Urine: 109 mg/dL
Total Protein, Urine: <6 mg/dL

## 2014-09-24 LAB — COMPREHENSIVE METABOLIC PANEL
ALT: 13 U/L — ABNORMAL LOW (ref 14–54)
AST: 20 U/L (ref 15–41)
Albumin: 3.1 g/dL — ABNORMAL LOW (ref 3.5–5.0)
Alkaline Phosphatase: 101 U/L (ref 38–126)
Anion gap: 6 (ref 5–15)
BUN: 7 mg/dL (ref 6–20)
CHLORIDE: 106 mmol/L (ref 101–111)
CO2: 22 mmol/L (ref 22–32)
CREATININE: 0.45 mg/dL (ref 0.44–1.00)
Calcium: 8.8 mg/dL — ABNORMAL LOW (ref 8.9–10.3)
GFR calc Af Amer: >60 mL/min (ref >60)
GFR calc non Af Amer: >60 mL/min (ref >60)
GLUCOSE: 79 mg/dL (ref 65–99)
Potassium: 3.6 mmol/L (ref 3.5–5.1)
Sodium: 134 mmol/L — ABNORMAL LOW (ref 135–145)
Total Bilirubin: 0.4 mg/dL (ref 0.3–1.2)
Total Protein: 7.2 g/dL (ref 6.5–8.1)

## 2014-09-24 LAB — CBC
HCT: 31.1 % — ABNORMAL LOW (ref 36.0–46.0)
HEMOGLOBIN: 10.8 g/dL — AB (ref 12.0–15.0)
MCH: 27.8 pg (ref 26.0–34.0)
MCHC: 34.7 g/dL (ref 30.0–36.0)
MCV: 80.2 fL (ref 78.0–100.0)
PLATELETS: 298 10*3/uL (ref 150–400)
RBC: 3.88 MIL/uL (ref 3.87–5.11)
RDW: 12.8 % (ref 11.5–15.5)
WBC: 13.4 10*3/uL — ABNORMAL HIGH (ref 4.0–10.5)

## 2014-09-24 LAB — LACTATE DEHYDROGENASE: LDH: 123 U/L (ref 98–192)

## 2014-09-24 LAB — URIC ACID: URIC ACID, SERUM: 4.9 mg/dL (ref 2.3–6.6)

## 2014-09-24 MED ORDER — BUTALBITAL-APAP-CAFFEINE 50-325-40 MG PO TABS: 1.0000 | ORAL_TABLET | Freq: Four times a day (QID) | ORAL | Status: DC | PRN

## 2014-09-24 NOTE — MAU Note (Signed)
C/o headache everyday since last Monday; elevated blood pressure; has not taken any medication for headache; sent by OB for PIH eval;

## 2014-09-24 NOTE — Discharge Instructions (Signed)

## 2014-09-25 DIAGNOSIS — D573 Sickle-cell trait: Secondary | ICD-10-CM

## 2014-09-25 NOTE — MAU Provider Note (Signed)
History   24 yo G2P1001 at 34 weeks presented after BP evaluation at office showed BP of 140/90.  Patient had called to report HA this week, denied visual sx or epigastric pain.  Hx pre-eclampsia with 1st pregnancy, treated with magnesium pp.  Has not taken anything for HA.  Patient Active Problem List   Diagnosis Date Noted  . Sickle cell trait 09/25/2014  . H/O shoulder dystocia in prior pregnancy, currently pregnant 09/25/2014  . Pre-eclampsia--with previous pregnancy 04/15/2013    Chief Complaint  Patient presents with  . Headache  . Hypertension   HPI:  As above  OB History    Gravida Para Term Preterm AB TAB SAB Ectopic Multiple Living   Past Medical History  Diagnosis Date  . Sickle cell trait     History reviewed. No pertinent past surgical history.  Family History  Problem Relation Age of Onset  . Hypertension Maternal Grandmother   . Diabetes Maternal Grandmother   . Hypertension Maternal Grandfather   . Heart disease Maternal Grandfather     History  Substance Use Topics  . Smoking status: Former Games developer  . Smokeless tobacco: Not on file  . Alcohol Use: No    Allergies: No Known Allergies  No prescriptions prior to admission    ROS:  Mild HA Physical Exam   Blood pressure 106/63, pulse 93, resp. rate 16, unknown if currently breastfeeding.  Filed Vitals:   09/24/14 1711 09/24/14 1721 09/24/14 1731 09/24/14 1826  BP: 112/72 119/70 116/78 106/63  Pulse: 95 91 92 93  Resp:    16   BP in MAU: 112/72, 112/72, 119/70, 116/78.  Physical Exam  In NAD Chest clear Heart RRR without murmur Abd gravid, NT Pelvic--deferred Ext DTR 1+, no clonus, trace edema  FHR Category 1 No UCs  Results for orders placed or performed during the hospital encounter of 09/24/14 (from the past 24 hour(s))  Comprehensive metabolic panel     Status: Abnormal   Collection Time: 09/24/14  5:06 PM  Result Value Ref Range   Sodium 134 (L) 135 -  145 mmol/L   Potassium 3.6 3.5 - 5.1 mmol/L   Chloride 106 101 - 111 mmol/L   CO2 22 22 - 32 mmol/L   Glucose, Bld 79 65 - 99 mg/dL   BUN 7 6 - 20 mg/dL   Creatinine, Ser 1.61 0.44 - 1.00 mg/dL   Calcium 8.8 (L) 8.9 - 10.3 mg/dL   Total Protein 7.2 6.5 - 8.1 g/dL   Albumin 3.1 (L) 3.5 - 5.0 g/dL   AST 20 15 - 41 U/L   ALT 13 (L) 14 - 54 U/L   Alkaline Phosphatase 101 38 - 126 U/L   Total Bilirubin 0.4 0.3 - 1.2 mg/dL   GFR calc non Af Amer >60 >60 mL/min   GFR calc Af Amer >60 >60 mL/min   Anion gap 6 5 - 15  Lactate dehydrogenase     Status: None   Collection Time: 09/24/14  5:06 PM  Result Value Ref Range   LDH 123 98 - 192 U/L  Uric acid     Status: None   Collection Time: 09/24/14  5:06 PM  Result Value Ref Range   Uric Acid, Serum 4.9 2.3 - 6.6 mg/dL  CBC     Status: Abnormal   Collection Time: 09/24/14  5:06 PM  Result Value Ref Range  WBC 13.4 (H) 4.0 - 10.5 K/uL   RBC 3.88 3.87 - 5.11 MIL/uL   Hemoglobin 10.8 (L) 12.0 - 15.0 g/dL   HCT 40.931.1 (L) 81.136.0 - 91.446.0 %   MCV 80.2 78.0 - 100.0 fL   MCH 27.8 26.0 - 34.0 pg   MCHC 34.7 30.0 - 36.0 g/dL   RDW 78.212.8 95.611.5 - 21.315.5 %   Platelets 298 150 - 400 K/uL  Protein / creatinine ratio, urine     Status: None   Collection Time: 09/24/14  5:41 PM  Result Value Ref Range   Creatinine, Urine 109.00 mg/dL   Total Protein, Urine <6 mg/dL   Protein Creatinine Ratio        0.00 - 0.15 mg/mg[Cre]    ED Course  Assessment: IUP at 34 weeks Mild HA No evidence pre-eclampsia or HTN  Plan: Rest at home this week--note given for work. Reviewed s/s of HTN/pre-eclampsia Rx Fioricet for HA--if patient elects to take, and has no benefit from med, she is to call. Keep scheduled visit next week at [facility].   Nigel BridgemanLATHAM, Cyerra Yim CNM, MSN 09/24/14 9p

## 2014-10-28 ENCOUNTER — Encounter (HOSPITAL_COMMUNITY): Payer: Self-pay | Admitting: *Deleted

## 2014-10-28 ENCOUNTER — Inpatient Hospital Stay (HOSPITAL_COMMUNITY)
Admission: AD | Admit: 2014-10-28 | Discharge: 2014-10-28 | Disposition: A | Discharge status: Home / Self Care | Payer: Medicaid Other | Source: Ambulatory Visit | Attending: Obstetrics and Gynecology | Admitting: Obstetrics and Gynecology

## 2014-10-28 DIAGNOSIS — O09299 Supervision of pregnancy with other poor reproductive or obstetric history, unspecified trimester: Secondary | ICD-10-CM

## 2014-10-28 DIAGNOSIS — O9989 Other specified diseases and conditions complicating pregnancy, childbirth and the puerperium: Secondary | ICD-10-CM | POA: Insufficient documentation

## 2014-10-28 DIAGNOSIS — R109 Unspecified abdominal pain: Secondary | ICD-10-CM | POA: Diagnosis present

## 2014-10-28 DIAGNOSIS — Z87891 Personal history of nicotine dependence: Secondary | ICD-10-CM | POA: Diagnosis not present

## 2014-10-28 DIAGNOSIS — Z3A38 38 weeks gestation of pregnancy: Secondary | ICD-10-CM | POA: Diagnosis not present

## 2014-10-28 LAB — CBC WITH DIFFERENTIAL/PLATELET
BASOS ABS: 0 10*3/uL (ref 0.0–0.1)
Basophils Relative: 0 % (ref 0–1)
Eosinophils Absolute: 0.1 10*3/uL (ref 0.0–0.7)
Eosinophils Relative: 1 % (ref 0–5)
HEMATOCRIT: 31.8 % — AB (ref 36.0–46.0)
HEMOGLOBIN: 10.6 g/dL — AB (ref 12.0–15.0)
LYMPHS ABS: 2.2 10*3/uL (ref 0.7–4.0)
Lymphocytes Relative: 22 % (ref 12–46)
MCH: 26.2 pg (ref 26.0–34.0)
MCHC: 33.3 g/dL (ref 30.0–36.0)
MCV: 78.7 fL (ref 78.0–100.0)
Monocytes Absolute: 0.7 10*3/uL (ref 0.1–1.0)
Monocytes Relative: 6 % (ref 3–12)
NEUTROS ABS: 7.3 10*3/uL (ref 1.7–7.7)
NEUTROS PCT: 71 % (ref 43–77)
Platelets: 288 10*3/uL (ref 150–400)
RBC: 4.04 MIL/uL (ref 3.87–5.11)
RDW: 13.5 % (ref 11.5–15.5)
WBC: 10.3 10*3/uL (ref 4.0–10.5)

## 2014-10-28 LAB — URINALYSIS, ROUTINE W REFLEX MICROSCOPIC
Bilirubin Urine: NEGATIVE
GLUCOSE, UA: NEGATIVE mg/dL
HGB URINE DIPSTICK: NEGATIVE
Ketones, ur: NEGATIVE mg/dL
Leukocytes, UA: NEGATIVE
Nitrite: NEGATIVE
PH: 6 (ref 5.0–8.0)
Protein, ur: NEGATIVE mg/dL
Specific Gravity, Urine: >1.03 — ABNORMAL HIGH (ref 1.005–1.030)
Urobilinogen, UA: 0.2 mg/dL (ref 0.0–1.0)

## 2014-10-28 LAB — COMPREHENSIVE METABOLIC PANEL
ALBUMIN: 2.5 g/dL — AB (ref 3.5–5.0)
ALK PHOS: 121 U/L (ref 38–126)
ALT: 12 U/L — ABNORMAL LOW (ref 14–54)
AST: 20 U/L (ref 15–41)
Anion gap: 12 (ref 5–15)
BUN: <5 mg/dL — ABNORMAL LOW (ref 6–20)
CO2: 20 mmol/L — ABNORMAL LOW (ref 22–32)
Calcium: 9.1 mg/dL (ref 8.9–10.3)
Chloride: 106 mmol/L (ref 101–111)
Creatinine, Ser: 0.6 mg/dL (ref 0.44–1.00)
GFR calc Af Amer: >60 mL/min (ref >60)
GFR calc non Af Amer: >60 mL/min (ref >60)
Glucose, Bld: 123 mg/dL — ABNORMAL HIGH (ref 65–99)
Potassium: 3.5 mmol/L (ref 3.5–5.1)
Sodium: 138 mmol/L (ref 135–145)
TOTAL PROTEIN: 6 g/dL — AB (ref 6.5–8.1)
Total Bilirubin: 0.5 mg/dL (ref 0.3–1.2)

## 2014-10-28 LAB — AMYLASE: AMYLASE: 49 U/L (ref 28–100)

## 2014-10-28 LAB — LIPASE, BLOOD: Lipase: 30 U/L (ref 22–51)

## 2014-10-28 MED ORDER — HYDROCODONE-ACETAMINOPHEN 5-300 MG PO TABS: 1.0000 | ORAL_TABLET | ORAL | Status: DC | PRN

## 2014-10-28 NOTE — MAU Provider Note (Signed)
History   24 ho G2P1001 at 38 6/7 weeks, presented after calling with right side constant abdominal pain, starting this am.  Denies leaking or bleeding, reports +FM.  Denies N/V, dysuria, HA, visual sx, or epigastric pain.  Cervix has been 1-2 cm, 60%, in office.  Has ROB scheduled at 1:30pm today.  Some "tingling" in right upper thigh, extending down leg.  No limitations of mobility or sensation.  Patient requests membrane sweeping--"need to have this baby", had membranes swept before last labor with success.   Patient Active Problem List   Diagnosis Date Noted  . Sickle cell trait 09/25/2014  . H/O shoulder dystocia in prior pregnancy, currently pregnant 09/25/2014  . Pre-eclampsia--with previous pregnancy 04/15/2013    Chief Complaint  Patient presents with  . Abdominal Pain   HPI:  As above  OB History    Gravida Para Term Preterm AB TAB SAB Ectopic Multiple Living   2 1 1       1       Past Medical History  Diagnosis Date  . Sickle cell trait     History reviewed. No pertinent past surgical history.  Family History  Problem Relation Age of Onset  . Hypertension Maternal Grandmother   . Diabetes Maternal Grandmother   . Hypertension Maternal Grandfather   . Heart disease Maternal Grandfather     History  Substance Use Topics  . Smoking status: Former Games developer  . Smokeless tobacco: Not on file  . Alcohol Use: No    Allergies: No Known Allergies  Prescriptions prior to admission  Medication Sig Dispense Refill Last Dose  . butalbital-acetaminophen-caffeine (FIORICET) 50-325-40 MG per tablet Take 1-2 tablets by mouth every 6 (six) hours as needed for headache. 20 tablet 0   . Prenatal Vit-Fe Fumarate-FA (PRENATAL MULTIVITAMIN) TABS tablet Take 2 tablets by mouth daily at 12 noon.    09/23/2014 at Unknown time    ROS: Right side abdominal pain, +FM Physical Exam   Blood pressure 127/79, pulse 82, temperature 98.4 F (36.9 C), temperature source Oral, resp.  rate 16, unknown if currently breastfeeding.   Physical Exam  In NAD Chest clear Heart RRR without murmur Abd gravid, NT--no rebound, guarding, no epigastric or RUQ pain on palpation.  Baby positioned on right side. Uterus--NT Pelvic--cervix very posterior, 2, 70%, vtx, -1/0--too posterior to sweep Ext DTR 1+, no clonus, no edema  FHR Category 1 on initial 10 min, then sleep cycle. UCs occasional, mild  ED Course  Assessment: IUP at 38 6/7 weeks Right sided abdominal pain No evidence labor  Plan: CBC, diff, CMP, amylase, lipase Continuous EFM   Nigel Bridgeman CNM, MSN 10/28/2014 10:12 AM  Addendum: Results for orders placed or performed during the hospital encounter of 10/28/14 (from the past 24 hour(s))  Urinalysis, Routine w reflex microscopic (not at [facility])     Status: Abnormal   Collection Time: 10/28/14  9:38 AM  Result Value Ref Range   Color, Urine YELLOW YELLOW   APPearance CLEAR CLEAR   Specific Gravity, Urine >1.030 (H) 1.005 - 1.030   pH 6.0 5.0 - 8.0   Glucose, UA NEGATIVE NEGATIVE mg/dL   Hgb urine dipstick NEGATIVE NEGATIVE   Bilirubin Urine NEGATIVE NEGATIVE   Ketones, ur NEGATIVE NEGATIVE mg/dL   Protein, ur NEGATIVE NEGATIVE mg/dL   Urobilinogen, UA 0.2 0.0 - 1.0 mg/dL   Nitrite NEGATIVE NEGATIVE   Leukocytes, UA NEGATIVE NEGATIVE  Comprehensive metabolic panel     Status: Abnormal   Collection  Time: 10/28/14 10:20 AM  Result Value Ref Range   Sodium 138 135 - 145 mmol/L   Potassium 3.5 3.5 - 5.1 mmol/L   Chloride 106 101 - 111 mmol/L   CO2 20 (L) 22 - 32 mmol/L   Glucose, Bld 123 (H) 65 - 99 mg/dL   BUN <5 (L) 6 - 20 mg/dL   Creatinine, Ser 4.54 0.44 - 1.00 mg/dL   Calcium 9.1 8.9 - 09.8 mg/dL   Total Protein 6.0 (L) 6.5 - 8.1 g/dL   Albumin 2.5 (L) 3.5 - 5.0 g/dL   AST 20 15 - 41 U/L   ALT 12 (L) 14 - 54 U/L   Alkaline Phosphatase 121 38 - 126 U/L   Total Bilirubin 0.5 0.3 - 1.2 mg/dL   GFR calc non Af Amer >60 >60 mL/min   GFR calc Af  Amer >60 >60 mL/min   Anion gap 12 5 - 15  Amylase     Status: None   Collection Time: 10/28/14 10:20 AM  Result Value Ref Range   Amylase 49 28 - 100 U/L  Lipase, blood     Status: None   Collection Time: 10/28/14 10:20 AM  Result Value Ref Range   Lipase 30 22 - 51 U/L  CBC with Differential/Platelet     Status: Abnormal   Collection Time: 10/28/14 10:20 AM  Result Value Ref Range   WBC 10.3 4.0 - 10.5 K/uL   RBC 4.04 3.87 - 5.11 MIL/uL   Hemoglobin 10.6 (L) 12.0 - 15.0 g/dL   HCT 11.9 (L) 14.7 - 82.9 %   MCV 78.7 78.0 - 100.0 fL   MCH 26.2 26.0 - 34.0 pg   MCHC 33.3 30.0 - 36.0 g/dL   RDW 56.2 13.0 - 86.5 %   Platelets 288 150 - 400 K/uL   Neutrophils Relative % 71 43 - 77 %   Neutro Abs 7.3 1.7 - 7.7 K/uL   Lymphocytes Relative 22 12 - 46 %   Lymphs Abs 2.2 0.7 - 4.0 K/uL   Monocytes Relative 6 3 - 12 %   Monocytes Absolute 0.7 0.1 - 1.0 K/uL   Eosinophils Relative 1 0 - 5 %   Eosinophils Absolute 0.1 0.0 - 0.7 K/uL   Basophils Relative 0 0 - 1 %   Basophils Absolute 0.0 0.0 - 0.1 K/uL   Pain likely due to fetal positioning. Likely sciatica. Patient requests another attempt at membranes sweeping. Positioned flat in bed, with hips elevated on her own fists. Membranes swept--cervix 2 cm, 70%, vtx, -1, IBOW. Small amount bloody show noted. FHR Category 1. Mild irritability.  Labor precautions reviewed. Rx Vicodin, #24, no refills.  Patient to call and schedule ROB visit for next week. To call/return with s/s increasing labor, decreased FM, SROM, etc.  Nigel Bridgeman, CNM 10/28/14 12:30p

## 2014-10-28 NOTE — Discharge Instructions (Signed)
Braxton Hicks Contractions °Contractions of the uterus can occur throughout pregnancy. Contractions are not always a sign that you are in labor.  °WHAT ARE BRAXTON HICKS CONTRACTIONS?  °Contractions that occur before labor are called Braxton Hicks contractions, or false labor. Toward the end of pregnancy (32-34 weeks), these contractions can develop more often and may become more forceful. This is not true labor because these contractions do not result in opening (dilatation) and thinning of the cervix. They are sometimes difficult to tell apart from true labor because these contractions can be forceful and people have different pain tolerances. You should not feel embarrassed if you go to the hospital with false labor. Sometimes, the only way to tell if you are in true labor is for your health care provider to look for changes in the cervix. °If there are no prenatal problems or other health problems associated with the pregnancy, it is completely safe to be sent home with false labor and await the onset of true labor. °HOW CAN YOU TELL THE DIFFERENCE BETWEEN TRUE AND FALSE LABOR? °False Labor °· The contractions of false labor are usually shorter and not as hard as those of true labor.   °· The contractions are usually irregular.   °· The contractions are often felt in the front of the lower abdomen and in the groin.   °· The contractions may go away when you walk around or change positions while lying down.   °· The contractions get weaker and are shorter lasting as time goes on.   °· The contractions do not usually become progressively stronger, regular, and closer together as with true labor.   °True Labor °· Contractions in true labor last 30-70 seconds, become very regular, usually become more intense, and increase in frequency.   °· The contractions do not go away with walking.   °· The discomfort is usually felt in the top of the uterus and spreads to the lower abdomen and low back.   °· True labor can be  determined by your health care provider with an exam. This will show that the cervix is dilating and getting thinner.   °WHAT TO REMEMBER °· Keep up with your usual exercises and follow other instructions given by your health care provider.   °· Take medicines as directed by your health care provider.   °· Keep your regular prenatal appointments.   °· Eat and drink lightly if you think you are going into labor.   °· If Braxton Hicks contractions are making you uncomfortable:   °¨ Change your position from lying down or resting to walking, or from walking to resting.   °¨ Sit and rest in a tub of warm water.   °¨ Drink 2-3 glasses of water. Dehydration may cause these contractions.   °¨ Do slow and deep breathing several times an hour.   °WHEN SHOULD I SEEK IMMEDIATE MEDICAL CARE? °Seek immediate medical care if: °· Your contractions become stronger, more regular, and closer together.   °· You have fluid leaking or gushing from your vagina.   °· You have a fever.   °· You pass blood-tinged mucus.   °· You have vaginal bleeding.   °· You have continuous abdominal pain.   °· You have low back pain that you never had before.   °· You feel your baby's head pushing down and causing pelvic pressure.   °· Your baby is not moving as much as it used to.   °Document Released: 03/15/2005 Document Revised: 03/20/2013 Document Reviewed: 12/25/2012 °[employer]® Patient Information ©2015 [employer]. This information is not intended to replace advice given to you by your health care   provider. Make sure you discuss any questions you have with your health care provider. ° °

## 2014-10-28 NOTE — MAU Note (Signed)
C/o constant lower abdominal right sided pain since 0730 this Am; denies any vaginal bleeding or vaginal leaking;

## 2014-11-07 ENCOUNTER — Inpatient Hospital Stay (HOSPITAL_COMMUNITY): Payer: Medicaid Other | Admitting: Anesthesiology

## 2014-11-07 ENCOUNTER — Inpatient Hospital Stay (HOSPITAL_COMMUNITY)
Admission: AD | Admit: 2014-11-07 | Discharge: 2014-11-09 | DRG: 775 | Disposition: A | Discharge status: Home / Self Care | Payer: Medicaid Other | Source: Ambulatory Visit | Attending: Obstetrics and Gynecology | Admitting: Obstetrics and Gynecology

## 2014-11-07 ENCOUNTER — Encounter (HOSPITAL_COMMUNITY): Payer: Self-pay

## 2014-11-07 DIAGNOSIS — Z833 Family history of diabetes mellitus: Secondary | ICD-10-CM

## 2014-11-07 DIAGNOSIS — Z3A40 40 weeks gestation of pregnancy: Secondary | ICD-10-CM | POA: Diagnosis present

## 2014-11-07 DIAGNOSIS — O9902 Anemia complicating childbirth: Secondary | ICD-10-CM | POA: Diagnosis present

## 2014-11-07 DIAGNOSIS — O09299 Supervision of pregnancy with other poor reproductive or obstetric history, unspecified trimester: Secondary | ICD-10-CM

## 2014-11-07 DIAGNOSIS — Z87891 Personal history of nicotine dependence: Secondary | ICD-10-CM

## 2014-11-07 DIAGNOSIS — Z8249 Family history of ischemic heart disease and other diseases of the circulatory system: Secondary | ICD-10-CM | POA: Diagnosis not present

## 2014-11-07 DIAGNOSIS — D573 Sickle-cell trait: Secondary | ICD-10-CM | POA: Diagnosis present

## 2014-11-07 HISTORY — DX: Anemia, unspecified: D64.9

## 2014-11-07 LAB — TYPE AND SCREEN
ABO/RH(D): B POS
ANTIBODY SCREEN: NEGATIVE

## 2014-11-07 LAB — RAPID URINE DRUG SCREEN, HOSP PERFORMED
Amphetamines: NOT DETECTED
BARBITURATES: NOT DETECTED
Benzodiazepines: NOT DETECTED
COCAINE: NOT DETECTED
Opiates: NOT DETECTED
TETRAHYDROCANNABINOL: NOT DETECTED

## 2014-11-07 LAB — CBC
HCT: 33.2 % — ABNORMAL LOW (ref 36.0–46.0)
HEMOGLOBIN: 11.3 g/dL — AB (ref 12.0–15.0)
MCH: 26 pg (ref 26.0–34.0)
MCHC: 34 g/dL (ref 30.0–36.0)
MCV: 76.5 fL — AB (ref 78.0–100.0)
Platelets: 335 10*3/uL (ref 150–400)
RBC: 4.34 MIL/uL (ref 3.87–5.11)
RDW: 13.8 % (ref 11.5–15.5)
WBC: 15.9 10*3/uL — ABNORMAL HIGH (ref 4.0–10.5)

## 2014-11-07 LAB — AMNISURE RUPTURE OF MEMBRANE (ROM) NOT AT ARMC: AMNISURE: NEGATIVE

## 2014-11-07 MED ORDER — ONDANSETRON HCL 4 MG/2ML IJ SOLN: 4.0000 mg | INTRAMUSCULAR | Status: DC | PRN

## 2014-11-07 MED ORDER — PRENATAL MULTIVITAMIN CH
1.0000 | ORAL_TABLET | Freq: Every day | ORAL | Status: DC
  Administered 2014-11-08: 1 via ORAL
  Filled 2014-11-07 (×2): qty 1

## 2014-11-07 MED ORDER — LANOLIN HYDROUS EX OINT: TOPICAL_OINTMENT | CUTANEOUS | Status: DC | PRN

## 2014-11-07 MED ORDER — WITCH HAZEL-GLYCERIN EX PADS
1.0000 "application " | MEDICATED_PAD | CUTANEOUS | Status: DC | PRN
  Administered 2014-11-07: 1 via TOPICAL

## 2014-11-07 MED ORDER — LACTATED RINGERS IV SOLN
INTRAVENOUS | Status: DC
  Administered 2014-11-07 (×2): via INTRAVENOUS

## 2014-11-07 MED ORDER — FENTANYL 2.5 MCG/ML BUPIVACAINE 1/10 % EPIDURAL INFUSION (WH - ANES)
14.0000 mL/h | INTRAMUSCULAR | Status: DC | PRN
  Administered 2014-11-07: 14 mL/h via EPIDURAL
  Filled 2014-11-07: qty 125

## 2014-11-07 MED ORDER — BENZOCAINE-MENTHOL 20-0.5 % EX AERO
1.0000 "application " | INHALATION_SPRAY | CUTANEOUS | Status: DC | PRN
  Administered 2014-11-07: 1 via TOPICAL
  Filled 2014-11-07 (×2): qty 56

## 2014-11-07 MED ORDER — ACETAMINOPHEN 325 MG PO TABS
650.0000 mg | ORAL_TABLET | ORAL | Status: DC | PRN
Start: 2014-11-07 — End: 2014-11-09

## 2014-11-07 MED ORDER — PHENYLEPHRINE 40 MCG/ML (10ML) SYRINGE FOR IV PUSH (FOR BLOOD PRESSURE SUPPORT)
80.0000 ug | PREFILLED_SYRINGE | INTRAVENOUS | Status: DC | PRN
  Filled 2014-11-07: qty 20
  Filled 2014-11-07: qty 2

## 2014-11-07 MED ORDER — OXYTOCIN BOLUS FROM INFUSION
500.0000 mL | INTRAVENOUS | Status: DC
  Administered 2014-11-07: 500 mL via INTRAVENOUS

## 2014-11-07 MED ORDER — DIPHENHYDRAMINE HCL 25 MG PO CAPS: 25.0000 mg | ORAL_CAPSULE | Freq: Four times a day (QID) | ORAL | Status: DC | PRN

## 2014-11-07 MED ORDER — SENNOSIDES-DOCUSATE SODIUM 8.6-50 MG PO TABS
2.0000 | ORAL_TABLET | ORAL | Status: DC
  Administered 2014-11-07 – 2014-11-08 (×2): 2 via ORAL
  Filled 2014-11-07 (×2): qty 2

## 2014-11-07 MED ORDER — EPHEDRINE 5 MG/ML INJ
10.0000 mg | INTRAVENOUS | Status: DC | PRN
  Filled 2014-11-07: qty 2

## 2014-11-07 MED ORDER — SIMETHICONE 80 MG PO CHEW: 80.0000 mg | CHEWABLE_TABLET | ORAL | Status: DC | PRN

## 2014-11-07 MED ORDER — OXYCODONE-ACETAMINOPHEN 5-325 MG PO TABS: 2.0000 | ORAL_TABLET | ORAL | Status: DC | PRN

## 2014-11-07 MED ORDER — LACTATED RINGERS IV SOLN
500.0000 mL | INTRAVENOUS | Status: DC | PRN
  Administered 2014-11-07: 500 mL via INTRAVENOUS

## 2014-11-07 MED ORDER — ACETAMINOPHEN 325 MG PO TABS: 650.0000 mg | ORAL_TABLET | ORAL | Status: DC | PRN

## 2014-11-07 MED ORDER — DIPHENHYDRAMINE HCL 50 MG/ML IJ SOLN: 12.5000 mg | INTRAMUSCULAR | Status: DC | PRN

## 2014-11-07 MED ORDER — DIBUCAINE 1 % RE OINT
1.0000 "application " | TOPICAL_OINTMENT | RECTAL | Status: DC | PRN
  Administered 2014-11-07: 1 via RECTAL
  Filled 2014-11-07: qty 28

## 2014-11-07 MED ORDER — LIDOCAINE HCL (PF) 1 % IJ SOLN
30.0000 mL | INTRAMUSCULAR | Status: DC | PRN
  Filled 2014-11-07: qty 30

## 2014-11-07 MED ORDER — LIDOCAINE HCL (PF) 1 % IJ SOLN
INTRAMUSCULAR | Status: DC | PRN
  Administered 2014-11-07 (×2): 4 mL via EPIDURAL

## 2014-11-07 MED ORDER — TETANUS-DIPHTH-ACELL PERTUSSIS 5-2.5-18.5 LF-MCG/0.5 IM SUSP: 0.5000 mL | Freq: Once | INTRAMUSCULAR | Status: DC

## 2014-11-07 MED ORDER — OXYCODONE-ACETAMINOPHEN 5-325 MG PO TABS
2.0000 | ORAL_TABLET | ORAL | Status: DC | PRN
Start: 2014-11-07 — End: 2014-11-09
  Filled 2014-11-07: qty 2

## 2014-11-07 MED ORDER — PHENYLEPHRINE 40 MCG/ML (10ML) SYRINGE FOR IV PUSH (FOR BLOOD PRESSURE SUPPORT): 80.0000 ug | PREFILLED_SYRINGE | INTRAVENOUS | Status: DC | PRN

## 2014-11-07 MED ORDER — ZOLPIDEM TARTRATE 5 MG PO TABS: 5.0000 mg | ORAL_TABLET | Freq: Every evening | ORAL | Status: DC | PRN

## 2014-11-07 MED ORDER — NALBUPHINE HCL 10 MG/ML IJ SOLN
5.0000 mg | INTRAMUSCULAR | Status: DC | PRN
  Administered 2014-11-07: 5 mg via INTRAVENOUS
  Filled 2014-11-07: qty 1

## 2014-11-07 MED ORDER — FENTANYL 2.5 MCG/ML BUPIVACAINE 1/10 % EPIDURAL INFUSION (WH - ANES): 14.0000 mL/h | INTRAMUSCULAR | Status: DC | PRN

## 2014-11-07 MED ORDER — ONDANSETRON HCL 4 MG/2ML IJ SOLN
4.0000 mg | Freq: Four times a day (QID) | INTRAMUSCULAR | Status: DC | PRN
  Administered 2014-11-07: 4 mg via INTRAVENOUS
  Filled 2014-11-07: qty 2

## 2014-11-07 MED ORDER — CITRIC ACID-SODIUM CITRATE 334-500 MG/5ML PO SOLN: 30.0000 mL | ORAL | Status: DC | PRN

## 2014-11-07 MED ORDER — IBUPROFEN 600 MG PO TABS
600.0000 mg | ORAL_TABLET | Freq: Four times a day (QID) | ORAL | Status: DC
  Administered 2014-11-07 – 2014-11-09 (×7): 600 mg via ORAL
  Filled 2014-11-07 (×8): qty 1

## 2014-11-07 MED ORDER — OXYCODONE-ACETAMINOPHEN 5-325 MG PO TABS
1.0000 | ORAL_TABLET | ORAL | Status: DC | PRN
  Administered 2014-11-08: 1 via ORAL

## 2014-11-07 MED ORDER — ONDANSETRON HCL 4 MG PO TABS: 4.0000 mg | ORAL_TABLET | ORAL | Status: DC | PRN

## 2014-11-07 MED ORDER — OXYCODONE-ACETAMINOPHEN 5-325 MG PO TABS: 1.0000 | ORAL_TABLET | ORAL | Status: DC | PRN

## 2014-11-07 MED ORDER — FERROUS SULFATE 325 (65 FE) MG PO TABS
325.0000 mg | ORAL_TABLET | Freq: Two times a day (BID) | ORAL | Status: DC
  Administered 2014-11-07 – 2014-11-08 (×3): 325 mg via ORAL
  Filled 2014-11-07 (×4): qty 1

## 2014-11-07 MED ORDER — OXYTOCIN 40 UNITS IN LACTATED RINGERS INFUSION - SIMPLE MED
62.5000 mL/h | INTRAVENOUS | Status: DC
  Filled 2014-11-07 (×2): qty 1000

## 2014-11-07 NOTE — MAU Note (Signed)
Pt presents for contractions. States she doesn't know how far apart they are. Pt states she thinks her water broke on her way into the hospital. States she doesn't know what color it is. Denies vaginal bleeding. States she hasn't felt the baby move today. FHT on monitor.

## 2014-11-07 NOTE — Anesthesia Procedure Notes (Signed)
Epidural Patient location during procedure: OB Start time: 11/07/2014 10:38 AM  Staffing Anesthesiologist: Mal Amabile Performed by: anesthesiologist   Preanesthetic Checklist Completed: patient identified, site marked, surgical consent, pre-op evaluation, timeout performed, IV checked, risks and benefits discussed and monitors and equipment checked  Epidural Patient position: sitting Prep: site prepped and draped and DuraPrep Patient monitoring: continuous pulse ox and blood pressure Approach: midline Location: L3-L4 Injection technique: LOR air  Needle:  Needle type: Tuohy  Needle gauge: 17 G Needle length: 9 cm and 9 Needle insertion depth: 5 cm cm Catheter type: closed end flexible Catheter size: 19 Gauge Catheter at skin depth: 10 cm Test dose: negative and Other  Assessment Events: blood not aspirated, injection not painful, no injection resistance, negative IV test and no paresthesia  Additional Notes Patient identified. Risks and benefits discussed including failed block, incomplete  Pain control, post dural puncture headache, nerve damage, paralysis, blood pressure Changes, nausea, vomiting, reactions to medications-both toxic and allergic and post Partum back pain. All questions were answered. Patient expressed understanding and wished to proceed. Sterile technique was used throughout procedure. Epidural site was Dressed with sterile barrier dressing. No paresthesias, signs of intravascular injection Or signs of intrathecal spread were encountered.  Patient was more comfortable after the epidural was dosed. Please see RN's note for documentation of vital signs and FHR which are stable.

## 2014-11-07 NOTE — Progress Notes (Signed)
Notified of negative amnisure. Will come see pt

## 2014-11-07 NOTE — H&P (Signed)
Yesenia Velez is a 24 y.o. female, G2 P1 at 40.2 weeks  Patient Active Problem List   Diagnosis Date Noted  . H/O shoulder dystocia in prior pregnancy, currently pregnant--mild 10/28/2014  . Sickle cell trait 09/25/2014  . Pre-eclampsia--with previous pregnancy 04/15/2013    Pregnancy Course: Patient entered care at 8.6 weeks.   EDC of 11/05/14 was established by LMP.      [place] evaluations:    18.1 weeks - Anatomy:EFW= 9 oz. = 68.6%tile. FHT's 159 bpm.  Singleton IUP, Vertex pres. Anterior fundal placenta. Placental edge is 7.2 cm from internal OS- Normal. VP= 5.5 cm. Profile/palate seen, NB seen. Philtrum seen. Open hands, 5th digit, heel and feet seen. Female gender. Two separate EIF's are seen within the Left ventricle of the heart. Ovaries/adnexas are wnl.       Significant prenatal events:   AS, THC   Last evaluation:   39.6 weeks   VE:4/50/-1 on 11/04/14  Reason for admission:  labor  Pt States:   Contractions Frequency: q4-5         Contraction severity: strong         Fetal activity: +FM  OB History    Gravida Para Term Preterm AB TAB SAB Ectopic Multiple Living   2 1 1       1      Past Medical History  Diagnosis Date  . Sickle cell trait    History reviewed. No pertinent past surgical history. Family History: family history includes Diabetes in her maternal grandmother; Heart disease in her maternal grandfather; Hypertension in her maternal grandfather and maternal grandmother. Social History:  reports that she has quit smoking. She does not have any smokeless tobacco history on file. She reports that she does not drink alcohol or use illicit drugs.   Prenatal Transfer Tool  Maternal Diabetes: No Genetic Screening: Normal Maternal Ultrasounds/Referrals: Normal Fetal Ultrasounds or other Referrals:  None Maternal Substance Abuse:  Yes:  Type: Marijuana  Significant Maternal Medications:  None Significant Maternal Lab Results: None   ROS:  See HPI above, all other  systems are negative  No Known Allergies  Dilation: 5 Effacement (%): 70 Station: -1 Exam by:: V. Marge Vandermeulen CNM unknown if currently breastfeeding.  Maternal Exam:  Uterine Assessment: Contraction frequency is rare.  Abdomen: Gravid, non tender. Fundal height is aga.  Normal external genitalia, vulva, cervix, uterus and adnexa.  No lesions noted on exam.  Pelvis adequate for delivery.  Fetal presentation: Vertex by VE  Fetal Exam:  Monitor Surveillance : Continuous Monitoring Mode: Ultrasound.  NICHD: Category CTXs: Q 4-10minutes EFW   7.5 lbs  Physical Exam: Nursing note and vitals reviewed General: alert and cooperative She appears well nourished Psychiatric: Normal mood and affect. Her behavior is normal Head: Normocephalic Eyes: Pupils are equal, round, and reactive to light Neck: Normal range of motion Cardiovascular: RRR without murmur  Respiratory: CTAB. Effort normal  Abd: soft, non-tender, +BS, no rebound, no guarding  Genitourinary: Vagina normal  Neurological: A&Ox3 Skin: Warm and dry  Musculoskeletal: Normal range of motion  Homan's sign negative bilaterally No evidence of DVTs.  Edema: Minimal bilaterally non-pitting edema DTR: 2+ Clonus: None   Prenatal labs: ABO, Rh:  B positive Antibody:  negative Rubella:   pending RPR:   NR HBsAg:    HIV:   NR GBS:   negative Sickle cell/Hgb electrophoresis:  WNL Pap:  GC:   negative Chlamydia: negative Genetic screenings:  negative Glucola:  negative Drug screen positive  Assessment:  IUP at 40.2 weeks NICHD: Category 1 Membranes: intact per negative amnisure GBS negative  Plan:  Admit to L&D for expectant/active management of labor. Possible augmentation options reviewed including AROM and/or pitocin.  IV pain medication per orders PRN Epidural per patient request Foley cath after patient is comfortable with epidural Anticipate SVD Labor mgmt as ordered Bedside US for EFW d/t Hx of  shoulder distochia      Attending MD available at all times.  Anneth Brunell, CNM, MSN 11/07/2014, 9:21 AM

## 2014-11-07 NOTE — Progress Notes (Signed)
Labor Progress  Subjective: No complaints  Objective: BP 125/75 mmHg  Pulse 85  Temp(Src) 97.8 F (36.6 C) (Oral)  Resp 18  Ht  (1.651 m)  Wt 220 lb (99.791 kg)  BMI 36.61 kg/m2  SpO2 96%   Total I/O In: -  Out: 150 [Emesis/NG output:150] FHT: CTX:  regular, every 1-2 minutes Uterus gravid, soft non tender SVE:  Dilation: 10 Effacement (%): 100 Station: 0 Exam by:: V. Charisa Twitty CNM    Assessment:  IUP at 40.2 weeks NICHD: Category 2 Membranes:  SROM x 6.5hrs, no s/s of infection Labor progress: adquate labor GBS: negative Put in high fowlers to labor down  Plan: Continue labor plan Continuousmonitoring Frequent position changes to facilitate fetal rotation and descent. Will reassess with cervical exam at 1600 or earlier if necessary       Aithana Kushner, CNM, MSN 11/07/2014. 1:51 PM

## 2014-11-07 NOTE — Progress Notes (Signed)
Labor Progress  Subjective: Comfortable with her epidural, able to sleep  Objective: BP 104/67 mmHg  Pulse 73  Temp(Src) 98.4 F (36.9 C) (Oral)  Resp 18  Ht  (1.651 m)  Wt 220 lb (99.791 kg)  BMI 36.61 kg/m2  SpO2 96%   Total I/O In: -  Out: 150 [Emesis/NG output:150] FHT: 135, moderate variability, + accel, early decel CTX:  regular, every 3 minutes Uterus gravid, soft non tender SVE:  8/90/-1   Assessment:  IUP at 40.2 weeks NICHD: Category 1 Membranes:  SROM x 5hrs, no s/s of infection Labor progress: adquate labor GBS: negative   Plan: Continue labor plan Continuous monitoring Frequent position changes to facilitate fetal rotation and descent. Will reassess with cervical exam at 1500 or earlier if necessary       Leviathan Macera, CNM, MSN 11/07/2014. 12:12 PM

## 2014-11-07 NOTE — MAU Note (Signed)
Report called to [facility] on BS. Will call when room is available

## 2014-11-07 NOTE — Anesthesia Preprocedure Evaluation (Signed)
Anesthesia Evaluation  Patient identified by MRN, date of birth, ID band Patient awake    Reviewed: Allergy & Precautions, NPO status , Patient's Chart, lab work & pertinent test results  Airway Mallampati: III  TM Distance: >3 FB Neck ROM: Full    Dental no notable dental hx. (+) Teeth Intact   Pulmonary former smoker,  breath sounds clear to auscultation  Pulmonary exam normal       Cardiovascular hypertension, Normal cardiovascular examRhythm:Regular Rate:Normal     Neuro/Psych negative neurological ROS  negative psych ROS   GI/Hepatic Neg liver ROS, GERD-  ,  Endo/Other  Obesity  Renal/GU negative Renal ROS  negative genitourinary   Musculoskeletal negative musculoskeletal ROS (+)   Abdominal (+) + obese,   Peds  Hematology  (+) anemia ,   Anesthesia Other Findings   Reproductive/Obstetrics (+) Pregnancy                             Anesthesia Physical Anesthesia Plan  ASA: II  Anesthesia Plan: Epidural   Post-op Pain Management:    Induction:   Airway Management Planned: Natural Airway  Additional Equipment:   Intra-op Plan:   Post-operative Plan:   Informed Consent: I have reviewed the patients History and Physical, chart, labs and discussed the procedure including the risks, benefits and alternatives for the proposed anesthesia with the patient or authorized representative who has indicated his/her understanding and acceptance.     Plan Discussed with: Anesthesiologist  Anesthesia Plan Comments:         Anesthesia Quick Evaluation

## 2014-11-07 NOTE — MAU Provider Note (Signed)
Yesenia Velez is a 24 y.o. G2P1 at 40.2 weeks presented to MAU unannounced ctx lof and ctx sonce 0500.    History     Patient Active Problem List   Diagnosis Date Noted  . H/O shoulder dystocia in prior pregnancy, currently pregnant--mild 10/28/2014  . Sickle cell trait 09/25/2014  . Pre-eclampsia--with previous pregnancy 04/15/2013    Chief Complaint  Patient presents with  . Labor Eval   HPI  OB History    Gravida Para Term Preterm AB TAB SAB Ectopic Multiple Living   Past Medical History  Diagnosis Date  . Sickle cell trait     History reviewed. No pertinent past surgical history.  Family History  Problem Relation Age of Onset  . Hypertension Maternal Grandmother   . Diabetes Maternal Grandmother   . Hypertension Maternal Grandfather   . Heart disease Maternal Grandfather     Social History  Substance Use Topics  . Smoking status: Former Games developer  . Smokeless tobacco: None  . Alcohol Use: No    Allergies: No Known Allergies  Prescriptions prior to admission  Medication Sig Dispense Refill Last Dose  . Prenatal Vit-Fe Fumarate-FA (PRENATAL MULTIVITAMIN) TABS tablet Take 2 tablets by mouth daily at 12 noon.    Past Week at Unknown time  . Hydrocodone-Acetaminophen 5-300 MG TABS Take 1 tablet by mouth every 4 (four) hours as needed. (Patient not taking: Reported on 11/07/2014) 24 each 0     ROS See HPI above, all other systems are negative  Physical Exam   unknown if currently breastfeeding.  Physical Exam Ext:  WNL ABD: Soft, non tender to palpation, no rebound or guarding SVE: 5/80/-1   ED Course  Assessment: IUP at  40.2weeks Membranes: questionable, wet on exam FHR: Category 1 CTX:  4-5 minutes amnisure negative  Plan: Admit to L&D for expectant management     Lennan Malone, CNM, MSN 11/07/2014. 9:07 AM

## 2014-11-08 LAB — CBC
HEMATOCRIT: 30.5 % — AB (ref 36.0–46.0)
Hemoglobin: 10.2 g/dL — ABNORMAL LOW (ref 12.0–15.0)
MCH: 25.8 pg — ABNORMAL LOW (ref 26.0–34.0)
MCHC: 33.4 g/dL (ref 30.0–36.0)
MCV: 77 fL — ABNORMAL LOW (ref 78.0–100.0)
Platelets: 271 10*3/uL (ref 150–400)
RBC: 3.96 MIL/uL (ref 3.87–5.11)
RDW: 13.8 % (ref 11.5–15.5)
WBC: 13.2 10*3/uL — ABNORMAL HIGH (ref 4.0–10.5)

## 2014-11-08 LAB — RPR: RPR Ser Ql: NONREACTIVE

## 2014-11-08 LAB — RUBELLA ANTIBODY, IGM

## 2014-11-08 NOTE — Progress Notes (Signed)
Subjective: Postpartum Day 1: Vaginal delivery, 1st degree perineal laceration Patient up ad lib, reports no syncope or dizziness.  Having significant cramping with breastfeeding. Feeding:  Breast Contraceptive plan:  Undecided--previous Nuvaring user.  Declines Nexplanon, OCPs.  Objective: Vital signs in last 24 hours: Temp:  [97.8 F (36.6 C)-99.5 F (37.5 C)] 98.2 F (36.8 C) (08/12 0600) Pulse Rate:  [73-110] 76 (08/12 0600) Resp:  [18-20] 18 (08/12 0600) BP: (98-137)/(56-92) 137/84 mmHg (08/12 0600) SpO2:  [96 %-99 %] 99 % (08/11 2142) Weight:  [99.791 kg (220 lb)] 99.791 kg (220 lb) (08/11 0945)  Physical Exam:  General: alert Lochia: appropriate Uterine Fundus: firm Perineum: healing well DVT Evaluation: No evidence of DVT seen on physical exam. Negative Homan's sign.    Recent Labs  11/07/14 0920 11/08/14 0545  HGB 11.3* 10.2*  HCT 33.2* 30.5*    Assessment/Plan: Status post vaginal delivery day 1. Stable Continue current care. Plan for discharge tomorrow  Motrin and Percocet for pain.    Nyra Capes 11/08/2014, 9:11 AM

## 2014-11-08 NOTE — Discharge Instructions (Signed)

## 2014-11-08 NOTE — Discharge Summary (Signed)
  Vaginal Delivery Discharge Summary  Yesenia Velez  DOB:    [birth year] MRN:    [id number] CSN:    [id number]  Date of admission:                  11/07/14  Date of discharge:                   11/09/14  Procedures this admission:   SVB, repair of 1st degree perineal laceration  Date of Delivery: 11/07/14  Newborn Data:  Live born female  Birth Weight: 8 lb 5.6 oz (3788 g) APGAR: 9, 9  Home with mother. Name: Yesenia Velez   History of Present Illness:  Yesenia Velez is a 24 y.o. female, G2P2002, who presents at [redacted]w[redacted]d weeks gestation. The patient has been followed at [facility] division [facility]. She was admitted for onset of labor. Her pregnancy has been complicated by:  Patient Active Problem List   Diagnosis Date Noted  . Normal vaginal delivery 11/07/2014  . H/O shoulder dystocia in prior pregnancy, currently pregnant--mild 10/28/2014  . Sickle cell trait 09/25/2014  . Pre-eclampsia--with previous pregnancy 04/15/2013     Hospital Course:   Admitting Dx:  IUP at 40 2/7 weeks, active labor GBS Status:  Negative Delivering Clinician: Venus Standard, CNM Lacerations/MLE: 1st degree perineal Complications: None  Intrapartum Procedures: spontaneous vaginal delivery Postpartum Procedures: none Complications-Operative and Postpartum: 1st degree perineal laceration  Discharge Diagnoses: Term Pregnancy-delivered  Feeding:  breast  Contraception:  oral progesterone-only contraceptive  Hemoglobin Results:  CBC Latest Ref Rng 11/08/2014 11/07/2014 10/28/2014  WBC 4.0 - 10.5 K/uL 13.2(H) 15.9(H) 10.3  Hemoglobin 12.0 - 15.0 g/dL 10.2(L) 11.3(L) 10.6(L)  Hematocrit 36.0 - 46.0 % 30.5(L) 33.2(L) 31.8(L)  Platelets 150 - 400 K/uL 271 335 288    Discharge Physical Exam:   General: alert Lochia: appropriate Uterine Fundus: firm Incision: healing well DVT Evaluation: No evidence of DVT seen on physical  exam. Negative Homan's sign.   Discharge Information:  Activity:           pelvic rest Diet:                routine Medications: Ibuprofen and Percocet Condition:      stable Instructions:  Routine pp instructions   Discharge to: home  Follow-up Information    Follow up with [facility] Gy[facility]. Schedule an appointment as soon as possible for a visit in 6 weeks.   Specialty:  Obstetrics and Gynecology   Why:  Call for any questions or concerns.   Contact information:   [address] [place] [address] [phone]       Nigel Bridgeman [facility] 11/08/2014 10:46a

## 2014-11-08 NOTE — Anesthesia Postprocedure Evaluation (Signed)
  Anesthesia Post-op Note  Patient: Yesenia Velez  Procedure(s) Performed: * No procedures listed *  Patient Location: Mother/Baby  Anesthesia Type:Epidural  Level of Consciousness: awake and alert   Airway and Oxygen Therapy: Patient Spontanous Breathing  Post-op Pain: mild  Post-op Assessment: Post-op Vital signs reviewed, Patient's Cardiovascular Status Stable, Respiratory Function Stable, No signs of Nausea or vomiting, Pain level controlled, No headache, Spinal receding and Patient able to bend at knees              Post-op Vital Signs: Reviewed  Last Vitals:  Filed Vitals:   11/08/14 0600  BP: 137/84  Pulse: 76  Temp: 36.8 C  Resp: 18    Complications: No apparent anesthesia complications

## 2014-11-08 NOTE — Lactation Note (Signed)
This note was copied from the chart of Girl Jacee Smith. Lactation Consultation Note  Patient Name: Girl Vertie Katrinka Blazing Today's Date: 11/08/2014 Reason for consult: Initial assessment Mom c/o of sore nipple on left breast after last feeding. Positional stripe noted. Care for sore nipples reviewed, advised to apply EBM, comfort gels given with instructions. Basic teaching reviewed with Mom. Lactation brochure left for review, advised of OP services and support group. Encouraged Mom to alternate positions with feedings to relieve soreness. Encouraged to call for assist with next feeding.   Maternal Data Has patient been taught Hand Expression?: Yes Does the patient have breastfeeding experience prior to this delivery?: No (pumped and bottle fed 1st child for 4 months)  Feeding    LATCH Score/Interventions          Comfort (Breast/Nipple): Filling, red/small blisters or bruises, mild/mod discomfort  Problem noted: Cracked, bleeding, blisters, bruises;Mild/Moderate discomfort Interventions  (Cracked/bleeding/bruising/blister): Expressed breast milk to nipple Interventions (Mild/moderate discomfort): Comfort gels        Lactation Tools Discussed/Used Tools: Comfort gels WIC Program: Yes   Consult Status Consult Status: Follow-up Date: 11/09/14 Follow-up type: In-patient    Alfred Levins 11/08/2014, 6:19 PM

## 2014-11-09 MED ORDER — OXYCODONE-ACETAMINOPHEN 5-325 MG PO TABS: 1.0000 | ORAL_TABLET | ORAL | Status: DC | PRN

## 2014-11-09 MED ORDER — HYDROCODONE-ACETAMINOPHEN 5-300 MG PO TABS: 1.0000 | ORAL_TABLET | ORAL | Status: DC | PRN

## 2014-11-09 MED ORDER — NORETHINDRONE 0.35 MG PO TABS: 1.0000 | ORAL_TABLET | Freq: Every day | ORAL | Status: DC

## 2014-11-09 MED ORDER — IBUPROFEN 600 MG PO TABS: 600.0000 mg | ORAL_TABLET | Freq: Four times a day (QID) | ORAL | Status: DC | PRN

## 2014-11-09 NOTE — Lactation Note (Signed)
This note was copied from the chart of Yesenia Velez. Lactation Consultation Note  Patient Name: Yesenia Velez Date: 11/09/2014 Reason for consult: Follow-up assessment   With this mom of a term infant, now 29 hours old. Mom was crying softly when I entered the room, saying she was "so tired" the baby was crying, so I assisted mom with latching and positioning for football hold. The baby latched well, and has visible swallows. Mom asked why the baby was feeding every 30 minutes. I noted that the baby's tongue just meets the gum line on extension, and forms a bowl shape with elevation. Mom does have tender nipples. I explained to mom what I noted with her baby's tongue, and that due to this, the baby may not be transferring as much milk, as she would if her tongue was not so tight.I also tole mom to put the baby in lots of tummy time, and with this, her tongue muscles may relax, making breast feeding easier for her.  Mom's breasts are full, but soft. She has a DEP at home. I advised her to pump to protect her milk supply and to provide supplement for her baby, as needed. Mom was not able to latch her first baby, and pumped and bottle fed. I advised mom to speak to her pediatrician if breast feeding continues to be an issue, and to also call lactation as needed, for support and o/p consults as needed.    Maternal Data    Feeding Feeding Type: Breast Fed Length of feed: 10 min  LATCH Score/Interventions Latch: Repeated attempts needed to sustain latch, nipple held in mouth throughout feeding, stimulation needed to elicit sucking reflex.  Audible Swallowing: A few with stimulation  Type of Nipple: Everted at rest and after stimulation  Comfort (Breast/Nipple): Filling, red/small blisters or bruises, mild/mod discomfort  Problem noted: Mild/Moderate discomfort Interventions  (Cracked/bleeding/bruising/blister): Expressed breast milk to nipple;Double electric pump Interventions  (Mild/moderate discomfort): Post-pump  Hold (Positioning): Assistance needed to correctly position infant at breast and maintain latch. Intervention(s): Breastfeeding basics reviewed;Support Pillows;Position options;Skin to skin  LATCH Score: 6  Lactation Tools Discussed/Used     Consult Status Consult Status: Complete Follow-up type: Call as needed    Alfred Levins 11/09/2014, 8:22 AM

## 2014-11-10 NOTE — Progress Notes (Signed)
CLINICAL SOCIAL WORK MATERNAL/CHILD NOTE  Patient Details  Name: Yesenia Velez MRN: [id number] Date of Birth: 11/07/2014  Date: 11/10/2014  Clinical Social Worker Initiating Note: Erin Obando BevelDate/ Time Initiated: 11/09/14/1130   Child's Name: Yesenia Velez   Legal Guardian:  (Parents Melony Overly and Harland Dingwall)   Need for Interpreter: None   Date of Referral: 11/08/14   Reason for Referral: Other (Comment)   Referral Source: [facility]   Address: [address] [place], [place] [address]  Phone number:  [phone])   Household Members: Significant Other   Natural Supports (not living in the home): Extended Family, Immediate Family   Professional Supports:None   Employment: (FOB is employed)   Type of Work:     Education:     Museum/gallery curator Resources:Medicaid   Other Resources: [employer], Physicist, medical    Cultural/Religious Considerations Which May Impact Care: none noted  Strengths: Ability to meet basic needs , Home prepared for child , Compliance with medical plan    Risk Factors/Current Problems:  (Hx of marijuana use)   Cognitive State: Alert    Mood/Affect: Happy , Interested    CSW Assessment: Acknowledged order for Social Work consult to assess mother's history of marijuana. Met mother who was pleasant and receptive to CSW. She has one other dependent age 1 months. Parents cohabitate. Mother states that FOB is very supportive. Mother acknowledged hx of marijuana. She reports using occasionally, but stopped once she found out that she was pregnant. She denies any need for treatment. She denies any other illicit drug use. Mother denies any hx of mental illness or PP Depression. She was informed of the hospital's drug screen policy. UDS on newborn is pending. Mother informed of social work Fish farm manager.   CSW Plan/Description:    No current barriers to discharge Will  continue to monitor drug screen

## 2014-11-14 ENCOUNTER — Ambulatory Visit (HOSPITAL_COMMUNITY): Admission: RE | Admit: 2014-11-14 | Payer: Medicaid Other | Source: Ambulatory Visit

## 2016-10-27 ENCOUNTER — Emergency Department (HOSPITAL_COMMUNITY)
Admission: EM | Admit: 2016-10-27 | Discharge: 2016-10-27 | Disposition: A | Discharge status: Home / Self Care | Payer: No Typology Code available for payment source | Attending: Emergency Medicine | Admitting: Emergency Medicine

## 2016-10-27 ENCOUNTER — Encounter (HOSPITAL_COMMUNITY): Payer: Self-pay | Admitting: *Deleted

## 2016-10-27 ENCOUNTER — Emergency Department (HOSPITAL_COMMUNITY): Payer: No Typology Code available for payment source

## 2016-10-27 DIAGNOSIS — Y9389 Activity, other specified: Secondary | ICD-10-CM | POA: Insufficient documentation

## 2016-10-27 DIAGNOSIS — M79604 Pain in right leg: Secondary | ICD-10-CM

## 2016-10-27 DIAGNOSIS — M25561 Pain in right knee: Secondary | ICD-10-CM | POA: Diagnosis not present

## 2016-10-27 DIAGNOSIS — Y929 Unspecified place or not applicable: Secondary | ICD-10-CM | POA: Insufficient documentation

## 2016-10-27 DIAGNOSIS — M25562 Pain in left knee: Secondary | ICD-10-CM | POA: Insufficient documentation

## 2016-10-27 DIAGNOSIS — Z87891 Personal history of nicotine dependence: Secondary | ICD-10-CM | POA: Insufficient documentation

## 2016-10-27 DIAGNOSIS — Y999 Unspecified external cause status: Secondary | ICD-10-CM | POA: Diagnosis not present

## 2016-10-27 DIAGNOSIS — Z79899 Other long term (current) drug therapy: Secondary | ICD-10-CM | POA: Insufficient documentation

## 2016-10-27 DIAGNOSIS — T148XXA Other injury of unspecified body region, initial encounter: Secondary | ICD-10-CM

## 2016-10-27 DIAGNOSIS — Z791 Long term (current) use of non-steroidal anti-inflammatories (NSAID): Secondary | ICD-10-CM | POA: Insufficient documentation

## 2016-10-27 DIAGNOSIS — S8991XA Unspecified injury of right lower leg, initial encounter: Secondary | ICD-10-CM | POA: Diagnosis present

## 2016-10-27 DIAGNOSIS — D649 Anemia, unspecified: Secondary | ICD-10-CM | POA: Diagnosis not present

## 2016-10-27 DIAGNOSIS — S8981XA Other specified injuries of right lower leg, initial encounter: Secondary | ICD-10-CM | POA: Diagnosis not present

## 2016-10-27 LAB — POC URINE PREG, ED: PREG TEST UR: NEGATIVE

## 2016-10-27 MED ORDER — CYCLOBENZAPRINE HCL 10 MG PO TABS: 10.0000 mg | ORAL_TABLET | Freq: Two times a day (BID) | ORAL | 0 refills | Status: DC | PRN

## 2016-10-27 MED ORDER — ACETAMINOPHEN 500 MG PO TABS
1000.0000 mg | ORAL_TABLET | Freq: Once | ORAL | Status: AC
  Administered 2016-10-27: 1000 mg via ORAL
  Filled 2016-10-27: qty 2
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
## 2016-10-27 NOTE — ED Notes (Signed)
Pt returned from xray
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
## 2018-01-04 ENCOUNTER — Encounter (HOSPITAL_COMMUNITY): Payer: Self-pay | Admitting: Emergency Medicine

## 2018-01-04 ENCOUNTER — Ambulatory Visit (INDEPENDENT_AMBULATORY_CARE_PROVIDER_SITE_OTHER): Payer: Managed Care, Other (non HMO)

## 2018-01-04 ENCOUNTER — Other Ambulatory Visit: Payer: Self-pay

## 2018-01-04 ENCOUNTER — Ambulatory Visit (HOSPITAL_COMMUNITY)
Admission: EM | Admit: 2018-01-04 | Discharge: 2018-01-04 | Disposition: A | Discharge status: Home / Self Care | Payer: Managed Care, Other (non HMO) | Attending: Family Medicine | Admitting: Family Medicine

## 2018-01-04 DIAGNOSIS — R1084 Generalized abdominal pain: Secondary | ICD-10-CM

## 2018-01-04 DIAGNOSIS — K5900 Constipation, unspecified: Secondary | ICD-10-CM

## 2018-01-04 LAB — POCT PREGNANCY, URINE: Preg Test, Ur: NEGATIVE

## 2018-01-04 LAB — POCT URINALYSIS DIP (DEVICE)
Bilirubin Urine: NEGATIVE
Glucose, UA: NEGATIVE mg/dL
Nitrite: NEGATIVE
PH: 5.5 (ref 5.0–8.0)
PROTEIN: NEGATIVE mg/dL
SPECIFIC GRAVITY, URINE: 1.015 (ref 1.005–1.030)
Urobilinogen, UA: 0.2 mg/dL (ref 0.0–1.0)

## 2018-01-04 NOTE — ED Provider Notes (Addendum)
[facility]   [id number] 01/04/18 Arrival Time: 4098  ASSESSMENT & PLAN:  1. Abdominal discomfort, generalized   2. Constipation, unspecified constipation type    Imaging: Dg Abd 2 Views  Result Date: 01/04/2018 CLINICAL DATA:  Patient c/o of umbilical abdominal pain x 3 days with diarrhea and nausea. Vomiting starting today. No Hx EXAM: ABDOMEN - 2 VIEW COMPARISON:  CT, 01/21/2009 FINDINGS: Normal bowel gas pattern. No evidence of renal or ureteral stones. Soft tissues are unremarkable. Normal skeletal structures. Clear lung bases. IMPRESSION: Negative. Electronically Signed   By: Amie Portland M.D.   On: 01/04/2018 11:02     Discharge Instructions     You have been seen today for abdominal pain. Your evaluation was not suggestive of any emergent condition requiring medical intervention at this time. However, some abdominal problems make take more time to appear. Therefore, it is important for you to watch for any new symptoms or worsening of your current condition. Please return here or to the Emergency Department immediately should you feel worse in any way or have any of the following symptoms: increasing or different abdominal pain, persistent vomiting, fevers, or shaking chills.  Return here or to the Emergency Department if your pain does not begin to improve within the next 24-48 hours.  You may try over the counter MIRALAX for the next few days. Do your best to ensure adequate fluid intake.    Suspect constipation.  Reviewed expectations re: course of current medical issues. Questions answered. Outlined signs and symptoms indicating need for more acute intervention. Patient verbalized understanding. After Visit Summary given.   SUBJECTIVE:  Yesenia Velez is a 27 y.o. female who presents with complaint of intermittent abdominal discomfort. Onset gradual, over the past couple of days. Location: generalized without radiation. Described as a full feeling with  occasional sharp exacerbation lasting a few minutes. Symptoms are unchanged since beginning. Aggravating factors: eating. Alleviating factors: none that are known. Associated symptoms: fatigue and one episode of emesis. No current nausea. She denies fever. Appetite: normal. PO intake: decreased. Ambulatory without assistance. Urinary symptoms: none. Last bowel movement yesterday "but small like pebbles" and without blood. OTC treatment: none reported.  No LMP recorded. (Menstrual status: IUD).   History reviewed. No pertinent surgical history.  ROS: As per HPI. All other systems negative.  OBJECTIVE:  Vitals:   01/04/18 0921  BP: 120/69  Pulse: 99  Temp: 98.7 F (37.1 C)  TempSrc: Oral  SpO2: 100%    General appearance: alert; no distress Lungs: clear to auscultation bilaterally Heart: regular rate and rhythm Abdomen: soft; non-distended; generalized mild tenderness to palpation; bowel sounds present; no masses or organomegaly; no guarding or rebound tenderness Back: no CVA tenderness; FROM at hips Extremities: no edema; symmetrical with no gross deformities Skin: warm and dry Neurologic: normal gait Psychological: alert and cooperative; normal mood and affect  Labs: Results for orders placed or performed during the hospital encounter of 01/04/18  POCT urinalysis dip (device)  Result Value Ref Range   Glucose, UA NEGATIVE NEGATIVE mg/dL   Bilirubin Urine NEGATIVE NEGATIVE   Ketones, ur >=160 (A) NEGATIVE mg/dL   Specific Gravity, Urine 1.015 1.005 - 1.030   Hgb urine dipstick TRACE (A) NEGATIVE   pH 5.5 5.0 - 8.0   Protein, ur NEGATIVE NEGATIVE mg/dL   Urobilinogen, UA 0.2 0.0 - 1.0 mg/dL   Nitrite NEGATIVE NEGATIVE   Leukocytes, UA TRACE (A) NEGATIVE  Pregnancy, urine POC  Result Value Ref Range  Preg Test, Ur NEGATIVE NEGATIVE   Labs Reviewed  POCT URINALYSIS DIP (DEVICE) - Abnormal; Notable for the following components:      Result Value   Ketones, ur >=160 (*)     Hgb urine dipstick TRACE (*)    Leukocytes, UA TRACE (*)    All other components within normal limits  POCT PREGNANCY, URINE    No Known Allergies                                             Past Medical History:  Diagnosis Date  . Anemia   . Sickle cell trait (HCC)    Social History   Socioeconomic History  . Marital status: Married    Spouse name: Not on file  . Number of children: Not on file  . Years of education: Not on file  . Highest education level: Not on file  Occupational History  . Not on file  Social Needs  . Financial resource strain: Not on file  . Food insecurity:    Worry: Not on file    Inability: Not on file  . Transportation needs:    Medical: Not on file    Non-medical: Not on file  Tobacco Use  . Smoking status: Former Games developer  . Smokeless tobacco: Never Used  Substance and Sexual Activity  . Alcohol use: No  . Drug use: Yes    Types: Marijuana    Comment: last +UDS on 04/01/2014  . Sexual activity: Not Currently  Lifestyle  . Physical activity:    Days per week: Not on file    Minutes per session: Not on file  . Stress: Not on file  Relationships  . Social connections:    Talks on phone: Not on file    Gets together: Not on file    Attends religious service: Not on file    Active member of club or organization: Not on file    Attends meetings of clubs or organizations: Not on file    Relationship status: Not on file  . Intimate partner violence:    Fear of current or ex partner: Not on file    Emotionally abused: Not on file    Physically abused: Not on file    Forced sexual activity: Not on file  Other Topics Concern  . Not on file  Social History Narrative  . Not on file   Family History  Problem Relation Age of Onset  . Hypertension Maternal Grandmother   . Diabetes Maternal Grandmother   . Hypertension Maternal Grandfather   . Heart disease Maternal Glynda Jaeger, MD 01/04/18 1610  Mardella Layman,  MD 01/04/18 [phone]

## 2018-01-04 NOTE — Discharge Instructions (Addendum)
You have been seen today for abdominal pain. Your evaluation was not suggestive of any emergent condition requiring medical intervention at this time. However, some abdominal problems make take more time to appear. Therefore, it is important for you to watch for any new symptoms or worsening of your current condition. Please return here or to the Emergency Department immediately should you feel worse in any way or have any of the following symptoms: increasing or different abdominal pain, persistent vomiting, fevers, or shaking chills.  Return here or to the Emergency Department if your pain does not begin to improve within the next 24-48 hours.  You may try over the counter MIRALAX for the next few days. Do your best to ensure adequate fluid intake.

## 2018-01-04 NOTE — ED Triage Notes (Signed)
Pt reports abdominal pain around her umbilicus that started yesterday as dull pain and now states they are very sharp.
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
## 2018-12-18 ENCOUNTER — Other Ambulatory Visit: Payer: Self-pay

## 2018-12-18 DIAGNOSIS — Z20822 Contact with and (suspected) exposure to covid-19: Secondary | ICD-10-CM

## 2018-12-19 LAB — NOVEL CORONAVIRUS, NAA: SARS-CoV-2, NAA: NOT DETECTED

## 2019-05-06 IMAGING — DX DG ABDOMEN 2V
3 series · 3 of 3 positions shown · non-contrast
Comparison: CT, 01/21/2009

CLINICAL DATA: Patient c/o of umbilical abdominal pain x 3 days
with diarrhea and nausea. Vomiting starting today. No Hx

EXAM:
ABDOMEN - 2 VIEW

[abdomen erect]
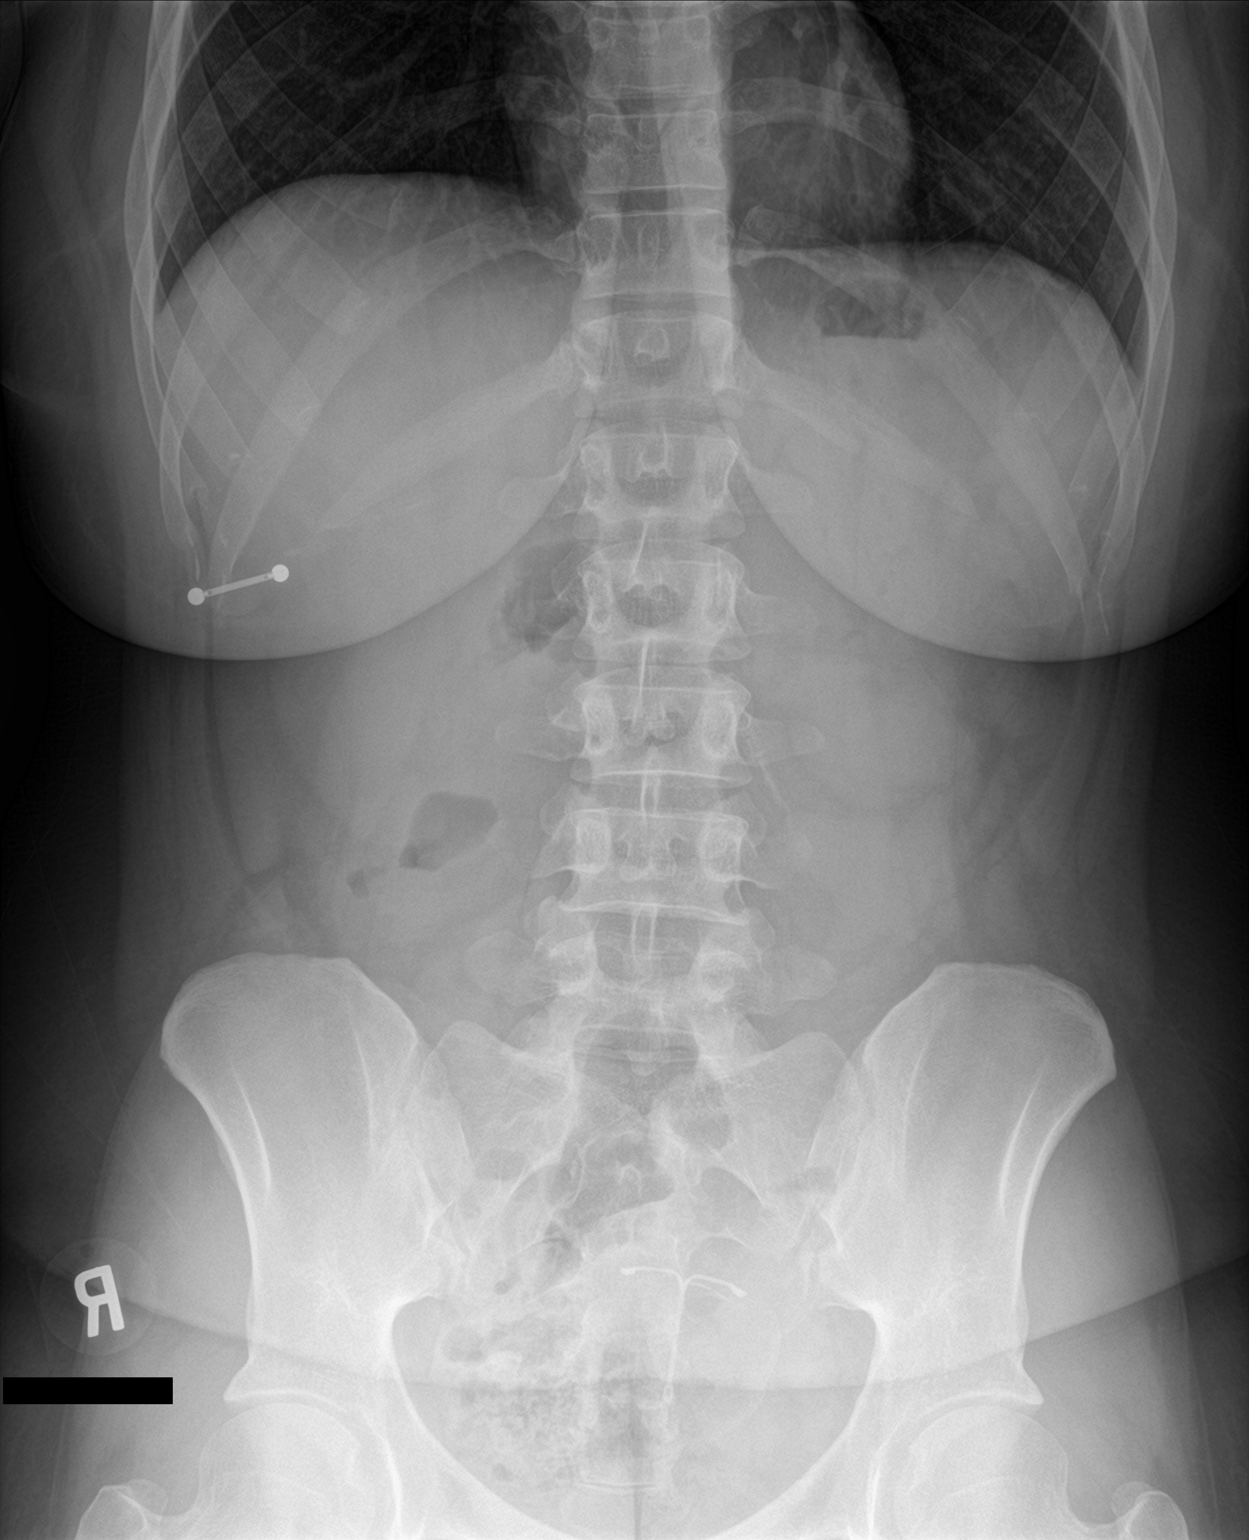

[abdomen supine (1 of 2)]
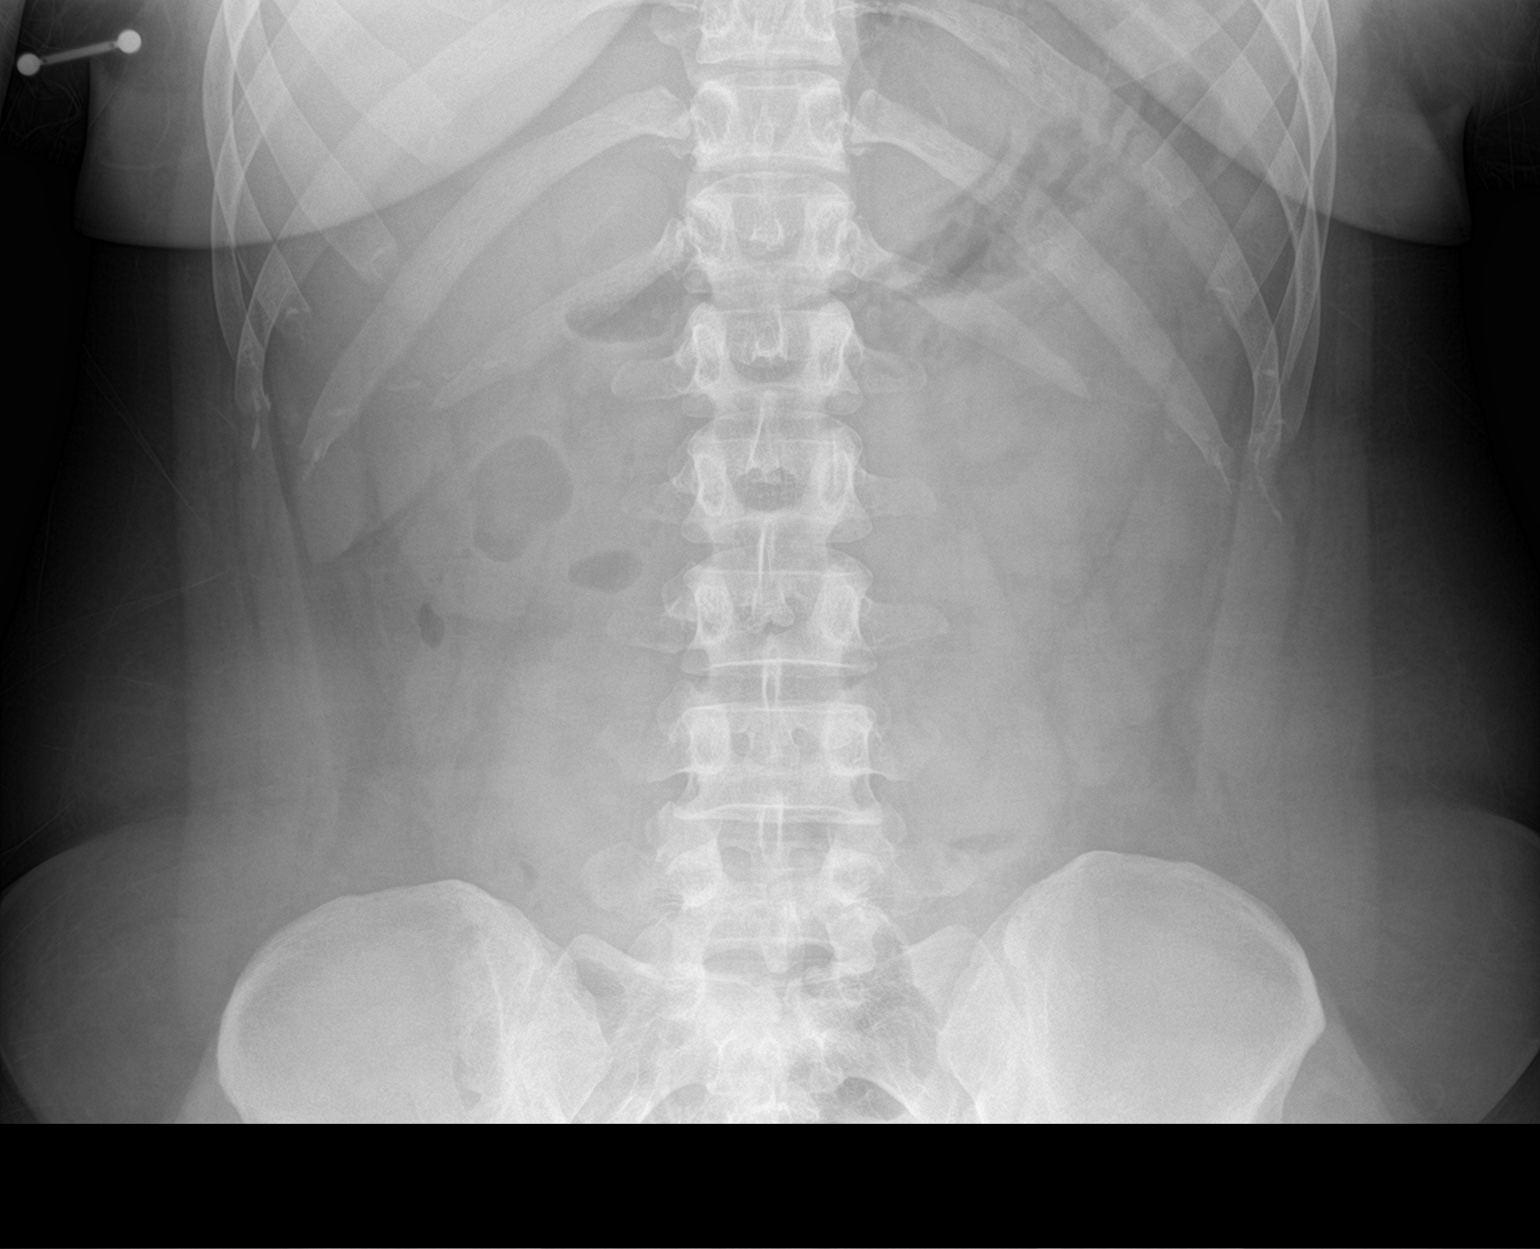

[abdomen supine (2 of 2)]
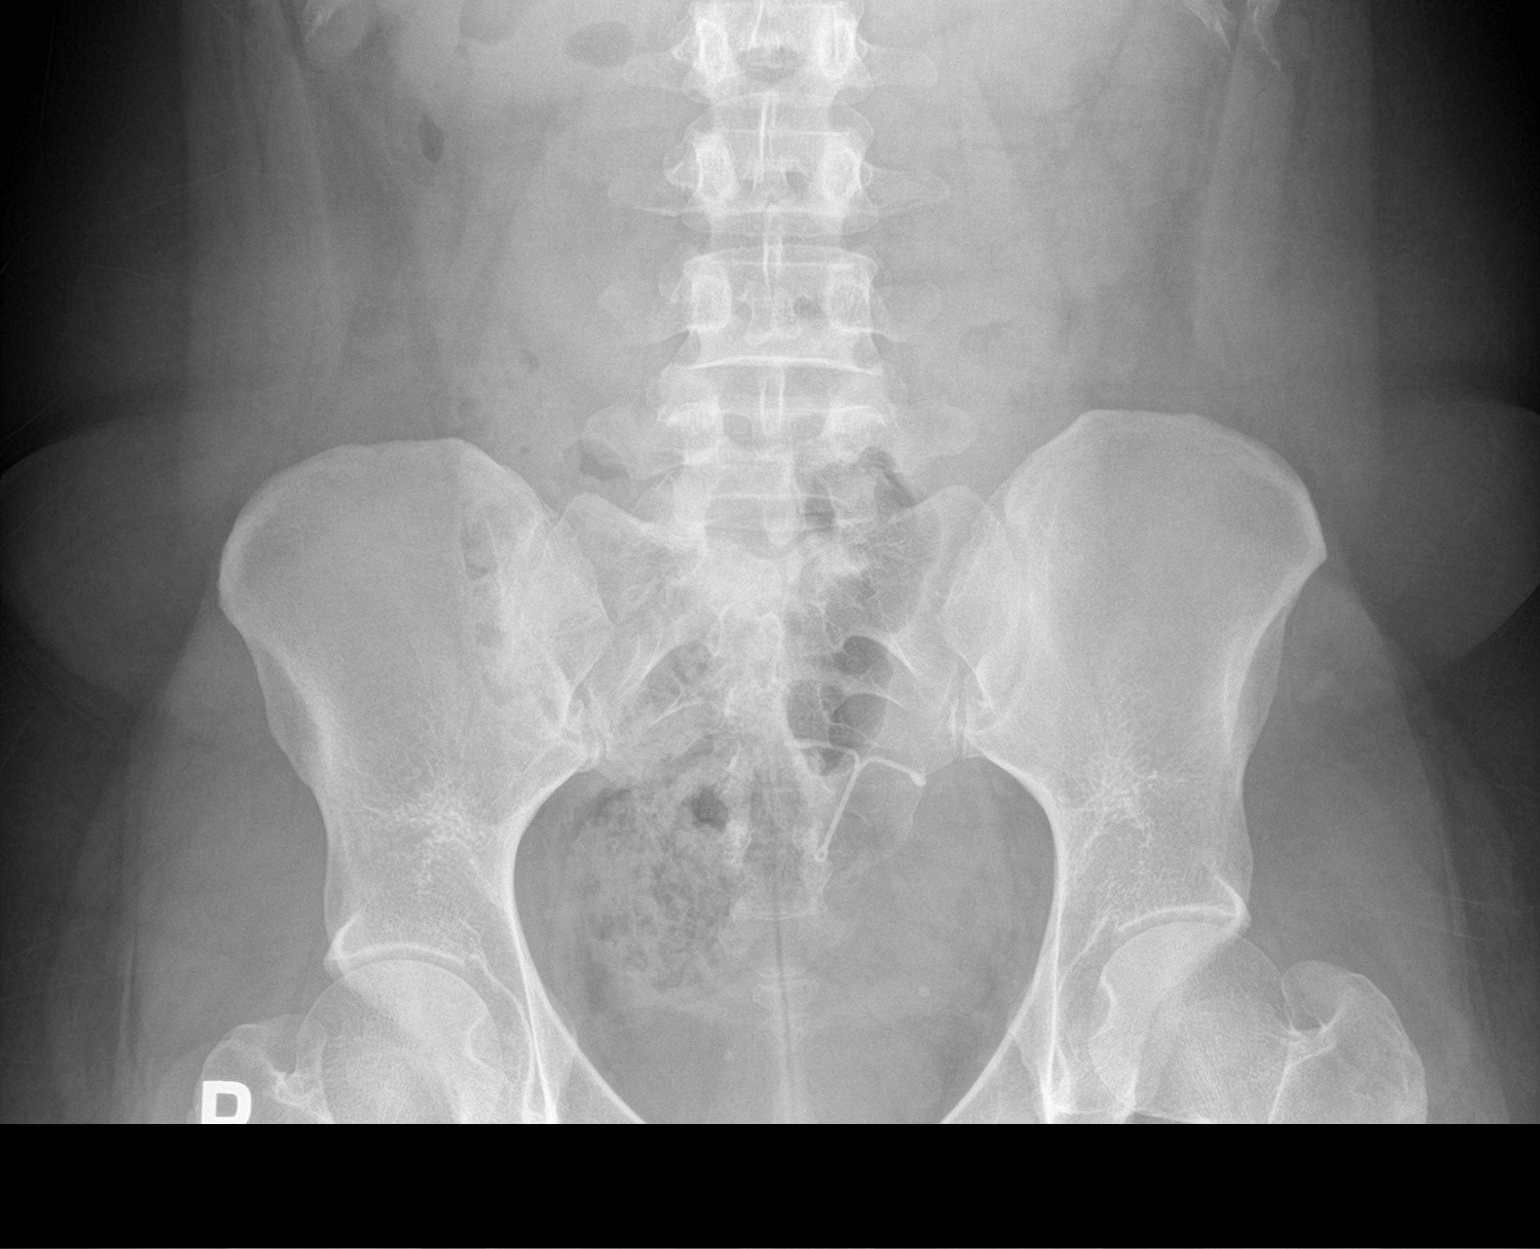

[3 of 3 positions shown; findings below may reference images not displayed]

FINDINGS: Normal bowel gas pattern. No evidence of renal or ureteral stones.
Soft tissues are unremarkable.

Normal skeletal structures.

Clear lung bases.
IMPRESSION: Negative.

## 2019-11-03 ENCOUNTER — Encounter (HOSPITAL_COMMUNITY): Payer: Self-pay

## 2019-11-03 ENCOUNTER — Emergency Department (HOSPITAL_COMMUNITY)
Admission: EM | Admit: 2019-11-03 | Discharge: 2019-11-04 | Disposition: A | Discharge status: Home / Self Care | Payer: Managed Care, Other (non HMO) | Attending: Emergency Medicine | Admitting: Emergency Medicine

## 2019-11-03 DIAGNOSIS — R1010 Upper abdominal pain, unspecified: Secondary | ICD-10-CM | POA: Diagnosis not present

## 2019-11-03 DIAGNOSIS — Z5321 Procedure and treatment not carried out due to patient leaving prior to being seen by health care provider: Secondary | ICD-10-CM | POA: Diagnosis not present

## 2019-11-03 DIAGNOSIS — R079 Chest pain, unspecified: Secondary | ICD-10-CM | POA: Insufficient documentation

## 2019-11-03 DIAGNOSIS — R110 Nausea: Secondary | ICD-10-CM | POA: Diagnosis not present

## 2019-11-03 LAB — URINALYSIS, ROUTINE W REFLEX MICROSCOPIC
Bilirubin Urine: NEGATIVE
Glucose, UA: NEGATIVE mg/dL
Ketones, ur: 20 mg/dL — AB
Nitrite: POSITIVE — AB
Protein, ur: NEGATIVE mg/dL
Specific Gravity, Urine: 1.01 (ref 1.005–1.030)
WBC, UA: >50 WBC/hpf — ABNORMAL HIGH (ref 0–5)
pH: 8 (ref 5.0–8.0)

## 2019-11-03 LAB — CBC
HCT: 39.1 % (ref 36.0–46.0)
Hemoglobin: 13.5 g/dL (ref 12.0–15.0)
MCH: 30.9 pg (ref 26.0–34.0)
MCHC: 34.5 g/dL (ref 30.0–36.0)
MCV: 89.5 fL (ref 80.0–100.0)
Platelets: 352 10*3/uL (ref 150–400)
RBC: 4.37 MIL/uL (ref 3.87–5.11)
RDW: 12.3 % (ref 11.5–15.5)
WBC: 12.9 10*3/uL — ABNORMAL HIGH (ref 4.0–10.5)
nRBC: 0 % (ref 0.0–0.2)

## 2019-11-03 LAB — COMPREHENSIVE METABOLIC PANEL
ALT: 17 U/L (ref 0–44)
AST: 26 U/L (ref 15–41)
Albumin: 3.8 g/dL (ref 3.5–5.0)
Alkaline Phosphatase: 52 U/L (ref 38–126)
Anion gap: 10 (ref 5–15)
BUN: 7 mg/dL (ref 6–20)
CO2: 26 mmol/L (ref 22–32)
Calcium: 9.6 mg/dL (ref 8.9–10.3)
Chloride: 101 mmol/L (ref 98–111)
Creatinine, Ser: 0.9 mg/dL (ref 0.44–1.00)
GFR calc Af Amer: >60 mL/min (ref >60)
GFR calc non Af Amer: >60 mL/min (ref >60)
Glucose, Bld: 97 mg/dL (ref 70–99)
Potassium: 3.6 mmol/L (ref 3.5–5.1)
Sodium: 137 mmol/L (ref 135–145)
Total Bilirubin: 1 mg/dL (ref 0.3–1.2)
Total Protein: 7.5 g/dL (ref 6.5–8.1)

## 2019-11-03 LAB — I-STAT BETA HCG BLOOD, ED (MC, WL, AP ONLY): I-stat hCG, quantitative: <5 m[IU]/mL (ref <5)

## 2019-11-03 LAB — LIPASE, BLOOD: Lipase: 28 U/L (ref 11–51)

## 2019-11-03 MED ORDER — SODIUM CHLORIDE 0.9% FLUSH: 3.0000 mL | Freq: Once | INTRAVENOUS | Status: DC

## 2019-11-03 NOTE — ED Triage Notes (Signed)
Pt states that this morning she began to have upper abd pain with nausea, states pain radiates to her chest

## 2019-11-04 ENCOUNTER — Ambulatory Visit (HOSPITAL_COMMUNITY): Payer: Self-pay

## 2019-11-04 ENCOUNTER — Encounter (HOSPITAL_COMMUNITY): Payer: Self-pay

## 2019-11-04 ENCOUNTER — Ambulatory Visit (HOSPITAL_COMMUNITY)
Admission: EM | Admit: 2019-11-04 | Discharge: 2019-11-04 | Disposition: A | Discharge status: Home / Self Care | Payer: Managed Care, Other (non HMO) | Attending: Emergency Medicine | Admitting: Emergency Medicine

## 2019-11-04 ENCOUNTER — Other Ambulatory Visit: Payer: Self-pay

## 2019-11-04 DIAGNOSIS — R112 Nausea with vomiting, unspecified: Secondary | ICD-10-CM

## 2019-11-04 DIAGNOSIS — Z3202 Encounter for pregnancy test, result negative: Secondary | ICD-10-CM

## 2019-11-04 DIAGNOSIS — R109 Unspecified abdominal pain: Secondary | ICD-10-CM

## 2019-11-04 DIAGNOSIS — N12 Tubulo-interstitial nephritis, not specified as acute or chronic: Secondary | ICD-10-CM | POA: Diagnosis not present

## 2019-11-04 LAB — POCT URINALYSIS DIPSTICK, ED / UC
Bilirubin Urine: NEGATIVE
Glucose, UA: NEGATIVE mg/dL
Ketones, ur: 15 mg/dL — AB
Nitrite: NEGATIVE
Protein, ur: 100 mg/dL — AB
Specific Gravity, Urine: 1.02 (ref 1.005–1.030)
Urobilinogen, UA: 1 mg/dL (ref 0.0–1.0)
pH: 7.5 (ref 5.0–8.0)

## 2019-11-04 LAB — POC URINE PREG, ED: Preg Test, Ur: NEGATIVE

## 2019-11-04 MED ORDER — HYDROCODONE-ACETAMINOPHEN 5-325 MG PO TABS: 1.0000 | ORAL_TABLET | Freq: Four times a day (QID) | ORAL | 0 refills | Status: DC | PRN

## 2019-11-04 MED ORDER — LIDOCAINE HCL (PF) 1 % IJ SOLN
INTRAMUSCULAR | Status: AC
  Filled 2019-11-04: qty 2

## 2019-11-04 MED ORDER — MORPHINE SULFATE (PF) 4 MG/ML IV SOLN
INTRAVENOUS | Status: AC
  Filled 2019-11-04: qty 1

## 2019-11-04 MED ORDER — CEFTRIAXONE SODIUM 1 G IJ SOLR
1.0000 g | Freq: Once | INTRAMUSCULAR | Status: AC
  Administered 2019-11-04: 1 g via INTRAMUSCULAR

## 2019-11-04 MED ORDER — ONDANSETRON HCL 4 MG/2ML IJ SOLN
INTRAMUSCULAR | Status: AC
  Filled 2019-11-04: qty 2

## 2019-11-04 MED ORDER — NAPROXEN 500 MG PO TABS
500.0000 mg | ORAL_TABLET | Freq: Two times a day (BID) | ORAL | 0 refills | Status: DC
Start: 2019-11-04 — End: 2023-06-09

## 2019-11-04 MED ORDER — ONDANSETRON 4 MG PO TBDP
4.0000 mg | ORAL_TABLET | Freq: Three times a day (TID) | ORAL | 0 refills | Status: DC | PRN
Start: 2019-11-04 — End: 2023-06-09

## 2019-11-04 MED ORDER — SODIUM CHLORIDE 0.9 % IV SOLN: Freq: Once | INTRAVENOUS | Status: AC

## 2019-11-04 MED ORDER — MORPHINE SULFATE (PF) 2 MG/ML IV SOLN
2.0000 mg | Freq: Once | INTRAVENOUS | Status: AC
  Administered 2019-11-04: 2 mg via INTRAVENOUS

## 2019-11-04 MED ORDER — CIPROFLOXACIN HCL 500 MG PO TABS
500.0000 mg | ORAL_TABLET | Freq: Two times a day (BID) | ORAL | 0 refills | Status: AC
Start: 2019-11-04 — End: 2019-11-11

## 2019-11-04 MED ORDER — CEFTRIAXONE SODIUM 1 G IJ SOLR
INTRAMUSCULAR | Status: AC
  Filled 2019-11-04: qty 10

## 2019-11-04 MED ORDER — ONDANSETRON HCL 4 MG/2ML IJ SOLN
4.0000 mg | Freq: Once | INTRAMUSCULAR | Status: AC
  Administered 2019-11-04: 4 mg via INTRAVENOUS
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
## 2019-11-04 NOTE — Discharge Instructions (Signed)
I am treating you for a kidney infection Continue with Cipro twice daily for the next week Zofran as needed for nausea Naprosyn twice daily for pain May use hydrocodone for severe pain  If pain not improving or worsening please go to the emergency room for further evaluation of pain

## 2019-11-04 NOTE — ED Triage Notes (Signed)
Pt presents with right side abdominal pain with nausea & vomiting since yesterday.

## 2019-11-05 NOTE — ED Provider Notes (Signed)
[facility]    CSN: [id number] Arrival date & time: 11/04/19  1440      History   Chief Complaint Chief Complaint  Patient presents with  . Appointment  . Nausea  . Vomiting  . Abdominal Pain    HPI Yesenia Velez is a 29 y.o. female presenting today for evaluation of abdominal pain.  Patient reports that yesterday she began to develop right-sided abdominal pain and back pain.  She has had associated nausea and vomiting.  She went to the emergency room and had blood work, but left before being seen.  On chart review of work-up in emergency room-lipase normal, CMP within normal limits, CBC with slight leukocytosis at 12.6, negative hCG, UA with positive nitrites and moderate leuks.  Patient does report over the past 1-1.5 weeks she has had some urinary symptoms of dysuria, incomplete voiding, urgency and frequency.  She was trying to treat this with Azo.   HPI  Past Medical History:  Diagnosis Date  . Anemia   . Sickle cell trait [facility])     Patient Active Problem List   Diagnosis Date Noted  . Normal vaginal delivery 11/07/2014  . H/O shoulder dystocia in prior pregnancy, currently pregnant--mild 10/28/2014  . Sickle cell trait (HCC) 09/25/2014  . Pre-eclampsia--with previous pregnancy 04/15/2013    History reviewed. No pertinent surgical history.  OB History    Gravida  2   Para  2   Term  2   Preterm      AB      Living  2     SAB      TAB      Ectopic      Multiple  0   Live Births  2            Home Medications    Prior to Admission medications   Medication Sig Start Date End Date Taking? Authorizing Provider  ciprofloxacin (CIPRO) 500 MG tablet Take 1 tablet (500 mg total) by mouth every 12 (twelve) hours for 7 days. 11/04/19 11/11/19  Irbin Fines C, PA-C  cyclobenzaprine (FLEXERIL) 10 MG tablet Take 1 tablet (10 mg total) by mouth 2 (two) times daily as needed for muscle spasms. 10/27/16   Maxwell Caul, PA-C   HYDROcodone-acetaminophen (NORCO/VICODIN) 5-325 MG tablet Take 1-2 tablets by mouth every 6 (six) hours as needed for severe pain. 11/04/19   Nicolemarie Wooley C, PA-C  ibuprofen (ADVIL,MOTRIN) 600 MG tablet Take 1 tablet (600 mg total) by mouth every 6 (six) hours as needed. 11/09/14   Nigel Bridgeman, CNM  naproxen (NAPROSYN) 500 MG tablet Take 1 tablet (500 mg total) by mouth 2 (two) times daily. 11/04/19   Jalessa Peyser C, PA-C  norethindrone (ORTHO MICRONOR) 0.35 MG tablet Take 1 tablet (0.35 mg total) by mouth daily. 11/09/14   Nigel Bridgeman, CNM  ondansetron (ZOFRAN ODT) 4 MG disintegrating tablet Take 1 tablet (4 mg total) by mouth every 8 (eight) hours as needed for nausea or vomiting. 11/04/19   Kelsa Jaworowski C, PA-C  Prenatal Vit-Fe Fumarate-FA (PRENATAL MULTIVITAMIN) TABS tablet Take 2 tablets by mouth daily at 12 noon.     [provider]    Family History Family History  Problem Relation Age of Onset  . Hypertension Maternal Grandmother   . Diabetes Maternal Grandmother   . Hypertension Maternal Grandfather   . Heart disease Maternal Grandfather     Social History Social History   Tobacco Use  . Smoking  status: Former Games developer  . Smokeless tobacco: Never Used  Substance Use Topics  . Alcohol use: No  . Drug use: Yes    Types: Marijuana    Comment: last +UDS on 04/01/2014     Allergies   Patient has no known allergies.   Review of Systems Review of Systems  Constitutional: Negative for fever.  Respiratory: Negative for shortness of breath.   Cardiovascular: Negative for chest pain.  Gastrointestinal: Positive for abdominal pain, nausea and vomiting. Negative for diarrhea.  Genitourinary: Positive for dysuria and frequency. Negative for flank pain, genital sores, hematuria, menstrual problem, vaginal bleeding, vaginal discharge and vaginal pain.  Musculoskeletal: Negative for back pain.  Skin: Negative for rash.  Neurological: Negative for dizziness,  light-headedness and headaches.     Physical Exam Triage Vital Signs ED Triage Vitals  Enc Vitals Group     BP 11/04/19 1531 132/81     Pulse Rate 11/04/19 1531 91     Resp 11/04/19 1531 (!) 24     Temp 11/04/19 1531 99.7 F (37.6 C)     Temp Source 11/04/19 1531 Oral     SpO2 11/04/19 1531 96 %     Weight --      Height --      Head Circumference --      Peak Flow --      Pain Score 11/04/19 1533 10     Pain Loc --      Pain Edu? --      Excl. in GC? --    No data found.  Updated Vital Signs BP 109/62 (BP Location: Right Arm)   Pulse 74   Temp 99.4 F (37.4 C) (Oral)   Resp 16   SpO2 100%   Visual Acuity Right Eye Distance:   Left Eye Distance:   Bilateral Distance:    Right Eye Near:   Left Eye Near:    Bilateral Near:     Physical Exam Vitals and nursing note reviewed.  Constitutional:      Appearance: She is well-developed.     Comments: Appears uncomfortable, holding abdomen doubled over  At end of visit patient resting comfortably  HENT:     Head: Normocephalic and atraumatic.     Ears:     Comments: Bilateral ears without tenderness to palpation of external auricle, tragus and mastoid, EAC's without erythema or swelling, TM's with good bony landmarks and cone of light. Non erythematous.     Nose: Nose normal.     Mouth/Throat:     Comments: Oral mucosa pink and moist, no tonsillar enlargement or exudate. Posterior pharynx patent and nonerythematous, no uvula deviation or swelling. Normal phonation.  Eyes:     Conjunctiva/sclera: Conjunctivae normal.  Cardiovascular:     Rate and Rhythm: Normal rate.  Pulmonary:     Effort: Pulmonary effort is normal. No respiratory distress.     Comments: Breathing comfortably at rest, CTABL, no wheezing, rales or other adventitious sounds auscultated  Abdominal:     General: There is no distension.     Comments: Abdomen soft, nondistended, significant tenderness throughout right lower quadrant and  suprapubic area, negative rebound, negative Rovsing's  Musculoskeletal:        General: Normal range of motion.     Cervical back: Neck supple.  Skin:    General: Skin is warm and dry.  Neurological:     Mental Status: She is alert and oriented to person, place, and time.  UC Treatments / Results  Labs (all labs ordered are listed, but only abnormal results are displayed) Labs Reviewed  POCT URINALYSIS DIPSTICK, ED / UC - Abnormal; Notable for the following components:      Result Value   Ketones, ur 15 (*)    Hgb urine dipstick MODERATE (*)    Protein, ur 100 (*)    Leukocytes,Ua LARGE (*)    All other components within normal limits  URINE CULTURE  POC URINE PREG, ED    EKG   Radiology No results found.  Procedures Procedures (including critical care time)  Medications Ordered in UC Medications  0.9 %  sodium chloride infusion (0 mLs Intravenous Stopped 11/04/19 1712)  cefTRIAXone (ROCEPHIN) injection 1 g (1 g Intramuscular Given 11/04/19 1625)  morphine 2 MG/ML injection 2 mg (2 mg Intravenous Given 11/04/19 1625)  ondansetron (ZOFRAN) injection 4 mg (4 mg Intravenous Given 11/04/19 1621)    Initial Impression / Assessment and Plan / UC Course  I have reviewed the triage vital signs and the nursing notes.  Pertinent labs & imaging results that were available during my care of the patient were reviewed by me and considered in my medical decision making (see chart for details).     UA today also consistent with UTI, given associated symptoms likely pyelonephritis.  Pain in right lower quadrant, discussed with patient cannot rule out appendicitis/other abdominal emergency, but given UA consistent with UTI patient previously waited in the emergency room for many hours last night we will go ahead and initiate treatment with fluids, Rocephin, Zofran for nausea and provided 2 mg of morphine while in clinic.  Will discharge home with Cipro, Zofran, pain medicine.  Close  monitoring of pain.  Advised if pain not improving in the next 24 to 48 hours or any worsening to return back to emergency room for further evaluation.  Discussed strict return precautions. Patient verbalized understanding and is agreeable with plan.  Final Clinical Impressions(s) / UC Diagnoses   Final diagnoses:  Pyelonephritis     Discharge Instructions     I am treating you for a kidney infection Continue with Cipro twice daily for the next week Zofran as needed for nausea Naprosyn twice daily for pain May use hydrocodone for severe pain  If pain not improving or worsening please go to the emergency room for further evaluation of pain   ED Prescriptions    Medication Sig Dispense Auth. Provider   ciprofloxacin (CIPRO) 500 MG tablet Take 1 tablet (500 mg total) by mouth every 12 (twelve) hours for 7 days. 14 tablet Khrystyna Schwalm C, PA-C   ondansetron (ZOFRAN ODT) 4 MG disintegrating tablet Take 1 tablet (4 mg total) by mouth every 8 (eight) hours as needed for nausea or vomiting. 20 tablet Sheleen Conchas C, PA-C   naproxen (NAPROSYN) 500 MG tablet Take 1 tablet (500 mg total) by mouth 2 (two) times daily. 30 tablet Macgregor Aeschliman C, PA-C   HYDROcodone-acetaminophen (NORCO/VICODIN) 5-325 MG tablet Take 1-2 tablets by mouth every 6 (six) hours as needed for severe pain. 8 tablet Stacee Earp, [place] C, PA-C     I have reviewed the PDMP during this encounter.   Sharyon Cable [place] C, PA-C 11/05/19 1104

## 2019-11-06 LAB — URINE CULTURE: Culture: >=100000 — AB

## 2023-05-03 ENCOUNTER — Other Ambulatory Visit: Payer: Self-pay | Admitting: Obstetrics and Gynecology

## 2023-06-09 ENCOUNTER — Encounter (HOSPITAL_COMMUNITY): Payer: Self-pay | Admitting: Obstetrics and Gynecology

## 2023-06-13 ENCOUNTER — Encounter (HOSPITAL_COMMUNITY): Payer: Self-pay | Admitting: Obstetrics and Gynecology

## 2023-06-13 NOTE — Progress Notes (Signed)
 Spoke w/ via phone for pre-op interview--- pt Lab needs dos---- urine preg/  cbc, t&s (surgeon orders needs second sign)       Lab results------  no COVID test -----patient states asymptomatic no test needed Arrive at -------  0730 on 06-16-2023 NPO after MN  Pre-Surgery Ensure or G2: n/a  Med rec completed Medications to take morning of surgery -----  none Diabetic medication ----- n/a  GLP1 agonist last dose: n/a GLP1 instructions:  Patient instructed no nail polish to be worn day of surgery Patient instructed to bring photo id and insurance card day of surgery Patient aware to have Driver (ride ) / caregiver    for 24 hours after surgery - husband, Yesenia Velez Patient Special Instructions -----  shower w/ antibacterial soap Pre-Op special Instructions -----  sent inbox message in epic to second sign orders to dr Su Hilt  Patient verbalized understanding of instructions that were given at this phone interview. Patient denies chest pain, sob, fever, cough at the interview.

## 2023-06-16 ENCOUNTER — Ambulatory Visit (HOSPITAL_COMMUNITY)
Admission: RE | Admit: 2023-06-16 | Payer: Managed Care, Other (non HMO) | Source: Home / Self Care | Admitting: Obstetrics and Gynecology

## 2023-06-16 ENCOUNTER — Encounter (HOSPITAL_COMMUNITY): Admission: RE | Payer: Self-pay | Source: Home / Self Care

## 2023-06-16 DIAGNOSIS — Z01818 Encounter for other preprocedural examination: Secondary | ICD-10-CM

## 2023-06-16 HISTORY — DX: Personal history of other complications of pregnancy, childbirth and the puerperium: Z87.59

## 2023-06-16 HISTORY — DX: Vitamin D deficiency, unspecified: E55.9

## 2023-06-16 HISTORY — DX: Personal history of other diseases of the female genital tract: Z87.42

## 2023-06-16 HISTORY — DX: Presence of spectacles and contact lenses: Z97.3

## 2023-06-16 SURGERY — SALPINGECTOMY, BILATERAL, LAPAROSCOPIC
Anesthesia: General
